# Patient Record
Sex: Female | Born: 1980 | Race: Black or African American | Hispanic: No | Smoking: Never smoker
Health system: Southern US, Community
[De-identification: ages and names within clinical notes are randomized; demographics above are authoritative.]

## PROBLEM LIST (undated history)

## (undated) DIAGNOSIS — M797 Fibromyalgia: Secondary | ICD-10-CM

## (undated) DIAGNOSIS — I10 Essential (primary) hypertension: Secondary | ICD-10-CM

## (undated) DIAGNOSIS — M549 Dorsalgia, unspecified: Secondary | ICD-10-CM

## (undated) DIAGNOSIS — J069 Acute upper respiratory infection, unspecified: Secondary | ICD-10-CM

## (undated) DIAGNOSIS — F32A Depression, unspecified: Secondary | ICD-10-CM

## (undated) DIAGNOSIS — G43909 Migraine, unspecified, not intractable, without status migrainosus: Secondary | ICD-10-CM

## (undated) DIAGNOSIS — F329 Major depressive disorder, single episode, unspecified: Secondary | ICD-10-CM

## (undated) HISTORY — PX: CHOLECYSTECTOMY: SHX55

## (undated) HISTORY — PX: BACK SURGERY: SHX140

## (undated) HISTORY — DX: Migraine, unspecified, not intractable, without status migrainosus: G43.909

## (undated) HISTORY — DX: Depression, unspecified: F32.A

## (undated) HISTORY — DX: Major depressive disorder, single episode, unspecified: F32.9

## (undated) HISTORY — DX: Fibromyalgia: M79.7

## (undated) HISTORY — DX: Dorsalgia, unspecified: M54.9

---

## 2011-08-12 DIAGNOSIS — I10 Essential (primary) hypertension: Secondary | ICD-10-CM

## 2011-08-12 DIAGNOSIS — I499 Cardiac arrhythmia, unspecified: Secondary | ICD-10-CM

## 2011-08-12 HISTORY — DX: Cardiac arrhythmia, unspecified: I49.9

## 2011-08-12 HISTORY — DX: Essential (primary) hypertension: I10

## 2011-08-30 DIAGNOSIS — R0602 Shortness of breath: Secondary | ICD-10-CM | POA: Insufficient documentation

## 2011-08-30 DIAGNOSIS — R002 Palpitations: Secondary | ICD-10-CM | POA: Insufficient documentation

## 2011-08-30 HISTORY — DX: Palpitations: R00.2

## 2011-08-30 HISTORY — DX: Shortness of breath: R06.02

## 2011-11-20 DIAGNOSIS — L293 Anogenital pruritus, unspecified: Secondary | ICD-10-CM

## 2011-11-20 DIAGNOSIS — H532 Diplopia: Secondary | ICD-10-CM

## 2011-11-20 DIAGNOSIS — M791 Myalgia, unspecified site: Secondary | ICD-10-CM | POA: Insufficient documentation

## 2011-11-20 DIAGNOSIS — R630 Anorexia: Secondary | ICD-10-CM | POA: Insufficient documentation

## 2011-11-20 DIAGNOSIS — R109 Unspecified abdominal pain: Secondary | ICD-10-CM

## 2011-11-20 DIAGNOSIS — M109 Gout, unspecified: Secondary | ICD-10-CM

## 2011-11-20 DIAGNOSIS — R5381 Other malaise: Secondary | ICD-10-CM

## 2011-11-20 DIAGNOSIS — G501 Atypical facial pain: Secondary | ICD-10-CM | POA: Insufficient documentation

## 2011-11-20 DIAGNOSIS — G5600 Carpal tunnel syndrome, unspecified upper limb: Secondary | ICD-10-CM

## 2011-11-20 DIAGNOSIS — R112 Nausea with vomiting, unspecified: Secondary | ICD-10-CM

## 2011-11-20 DIAGNOSIS — B009 Herpesviral infection, unspecified: Secondary | ICD-10-CM

## 2011-11-20 DIAGNOSIS — K5900 Constipation, unspecified: Secondary | ICD-10-CM | POA: Insufficient documentation

## 2011-11-20 DIAGNOSIS — R609 Edema, unspecified: Secondary | ICD-10-CM | POA: Insufficient documentation

## 2011-11-20 DIAGNOSIS — D508 Other iron deficiency anemias: Secondary | ICD-10-CM

## 2011-11-20 HISTORY — DX: Carpal tunnel syndrome, unspecified upper limb: G56.00

## 2011-11-20 HISTORY — DX: Nausea with vomiting, unspecified: R11.2

## 2011-11-20 HISTORY — DX: Other iron deficiency anemias: D50.8

## 2011-11-20 HISTORY — DX: Gout, unspecified: M10.9

## 2011-11-20 HISTORY — DX: Herpesviral infection, unspecified: B00.9

## 2011-11-20 HISTORY — DX: Atypical facial pain: G50.1

## 2011-11-20 HISTORY — DX: Diplopia: H53.2

## 2011-11-20 HISTORY — DX: Edema, unspecified: R60.9

## 2011-11-20 HISTORY — DX: Unspecified abdominal pain: R10.9

## 2011-11-20 HISTORY — DX: Myalgia, unspecified site: M79.10

## 2011-11-20 HISTORY — DX: Other malaise: R53.81

## 2011-11-20 HISTORY — DX: Anogenital pruritus, unspecified: L29.3

## 2014-09-02 ENCOUNTER — Encounter (HOSPITAL_COMMUNITY): Payer: Self-pay | Admitting: Emergency Medicine

## 2014-09-02 ENCOUNTER — Emergency Department (INDEPENDENT_AMBULATORY_CARE_PROVIDER_SITE_OTHER)
Admission: EM | Admit: 2014-09-02 | Discharge: 2014-09-02 | Disposition: A | Discharge status: Home / Self Care | Payer: Medicaid Other | Source: Home / Self Care | Attending: Family Medicine | Admitting: Family Medicine

## 2014-09-02 DIAGNOSIS — M609 Myositis, unspecified: Secondary | ICD-10-CM

## 2014-09-02 DIAGNOSIS — M7918 Myalgia, other site: Secondary | ICD-10-CM

## 2014-09-02 MED ORDER — TRIAMCINOLONE ACETONIDE 40 MG/ML IJ SUSP
INTRAMUSCULAR | Status: AC
  Filled 2014-09-02: qty 1

## 2014-09-02 MED ORDER — BUPIVACAINE HCL (PF) 0.5 % IJ SOLN
INTRAMUSCULAR | Status: AC
  Filled 2014-09-02: qty 10
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
## 2014-09-02 NOTE — Discharge Instructions (Signed)
Myofascial Pain Syndrome Myofascial pain syndrome is a pain disorder. This pain may be felt in the muscles. It may come and go. Myofascial pain syndrome always has trigger or tender points in the muscle that will cause pain when pressed.  CAUSES Myofascial pain may be caused by injuries, especially auto accidents, or by overuse of certain muscles. Typically the pain is long lasting. It is made worse by overuse of the involved muscles, emotional distress, and by cold, damp weather. Myofascial pain syndrome often develops in patients whose response to stress is an increase in muscle tone, and is seen in greater frequency in patients with pre-existing tension headaches. SYMPTOMS  Myofascial pain syndrome causes a wide variety of symptoms. You may see tight ropy bands of muscle. Problems may also include aching, cramping, burning, numbness, tingling, and other uncomfortable sensations in muscular areas. It most commonly affects the neck, upper back, and shoulder areas. Pain often radiates into the arms and hands.  TREATMENT Treatment includes resting the affected muscular area and applying ice packs to reduce spasm and pain. Trigger point injection, is a valuable initial therapy. This therapy is an injection of local anesthetic directly into the trigger point. Trigger points are often present at the source of pain. Pain relief following injection confirms the diagnosis of myofascial pain syndrome. Fairly vigorous therapy can be carried out during the pain-free period after each injection. Stretching exercises to loosen up the muscles are also useful. Transcutaneous electrical nerve stimulation (TENS) may provide relief from pain. TENS is the use of electric current produced by a device to stimulate the nerves. Ultrasound therapy applied directly over the affected muscle may also provide pain relief. Anti-inflammatory pain medicine can be helpful. Symptoms will gradually improve over a period of weeks to months  with proper treatment. HOME CARE INSTRUCTIONS Call your caregiver for follow-up care as recommended.  SEEK MEDICAL CARE IF:  Your pain is severe and not helped with medications. Document Released: 05/23/2004 Document Revised: 07/08/2011 Document Reviewed: 06/01/2010 [facility] Patient Information 2015 [employer] [place]. This information is not intended to replace advice given to you by your health care provider. Make sure you discuss any questions you have with your health care provider.  Trigger Point Injection Trigger points are areas where you have muscle pain. A trigger point injection is a shot given in the trigger point to relieve that pain. A trigger point might feel like a knot in your muscle. It hurts to press on a trigger point. Sometimes the pain spreads out (radiates) to other parts of the body. For example, pressing on a trigger point in your shoulder might cause pain in your arm or neck. You might have one trigger point. Or, you might have more than one. People often have trigger points in their upper back and lower back. They also occur often in the neck and shoulders. Pain from a trigger point lasts for a long time. It can make it hard to keep moving. You might not be able to do the exercise or physical therapy that could help you deal with the pain. A trigger point injection may help. It does not work for everyone. But, it may relieve your pain for a few days or a few months. A trigger point injection does not cure long-lasting (chronic) pain. LET YOUR CAREGIVER KNOW ABOUT:  Any allergies (especially to latex, lidocaine, or steroids).  Blood-thinning medicines that you take. These drugs can lead to bleeding or bruising after an injection. They include:  Aspirin.  Ibuprofen.  Clopidogrel.  Warfarin.  Other medicines you take. This includes all vitamins, herbs, eyedrops, over-the-counter medicines, and creams.  Use of steroids.  Recent infections.  Past problems with numbing  medicines.  Bleeding problems.  Surgeries you have had.  Other health problems. RISKS AND COMPLICATIONS A trigger point injection is a safe treatment. However, problems may develop, such as:  Minor side effects usually go away in 1 to 2 days. These may include:  Soreness.  Bruising.  Stiffness.  More serious problems are rare. But, they may include:  Bleeding under the skin (hematoma).  Skin infection.  Breaking off of the needle under your skin.  Lung puncture.  The trigger point injection may not work for you. BEFORE THE PROCEDURE You may need to stop taking any medicine that thins your blood. This is to prevent bleeding and bruising. Usually these medicines are stopped several days before the injection. No other preparation is needed. PROCEDURE  A trigger point injection can be given in your caregiver's office or in a clinic. Each injection takes 2 minutes or less.  Your caregiver will feel for trigger points. The caregiver may use a marker to circle the area for the injection.  The skin over the trigger point will be washed with a germ-killing (antiseptic) solution.  The caregiver pinches the spot for the injection.  Then, a very thin needle is used for the shot. You may feel pain or a twitching feeling when the needle enters the trigger point.  A numbing solution may be injected into the trigger point. Sometimes a drug to keep down swelling, redness, and warmth (inflammation) is also injected.  Your caregiver moves the needle around the trigger zone until the tightness and twitching goes away.  After the injection, your caregiver may put gentle pressure over the injection site.  Then it is covered with a bandage. AFTER THE PROCEDURE  You can go right home after the injection.  The bandage can be taken off after a few hours.  You may feel sore and stiff for 1 to 2 days.  Go back to your regular activities slowly. Your caregiver may ask you to stretch your  muscles. Do not do anything that takes extra energy for a few days.  Follow your caregiver's instructions to manage and treat other pain. Document Released: 04/04/2011 Document Revised: 08/10/2012 Document Reviewed: 04/04/2011 [facility] Patient Information 2015 [place], [place]. This information is not intended to replace advice given to you by your health care provider. Make sure you discuss any questions you have with your health care provider.

## 2014-10-06 ENCOUNTER — Ambulatory Visit: Payer: Self-pay | Admitting: Family Medicine

## 2014-10-19 ENCOUNTER — Ambulatory Visit: Payer: Self-pay | Admitting: Family Medicine

## 2014-11-03 ENCOUNTER — Ambulatory Visit (INDEPENDENT_AMBULATORY_CARE_PROVIDER_SITE_OTHER): Payer: Medicaid Other | Admitting: Family Medicine

## 2014-11-03 ENCOUNTER — Encounter: Payer: Self-pay | Admitting: Family Medicine

## 2014-11-03 DIAGNOSIS — R739 Hyperglycemia, unspecified: Secondary | ICD-10-CM

## 2014-11-03 DIAGNOSIS — M5136 Other intervertebral disc degeneration, lumbar region: Secondary | ICD-10-CM

## 2014-11-03 DIAGNOSIS — F329 Major depressive disorder, single episode, unspecified: Secondary | ICD-10-CM | POA: Diagnosis not present

## 2014-11-03 DIAGNOSIS — M51369 Other intervertebral disc degeneration, lumbar region without mention of lumbar back pain or lower extremity pain: Secondary | ICD-10-CM

## 2014-11-03 DIAGNOSIS — M5126 Other intervertebral disc displacement, lumbar region: Secondary | ICD-10-CM | POA: Diagnosis not present

## 2014-11-03 DIAGNOSIS — F32A Depression, unspecified: Secondary | ICD-10-CM | POA: Insufficient documentation

## 2014-11-03 DIAGNOSIS — Z7189 Other specified counseling: Secondary | ICD-10-CM

## 2014-11-03 DIAGNOSIS — R7309 Other abnormal glucose: Secondary | ICD-10-CM

## 2014-11-03 DIAGNOSIS — Z7689 Persons encountering health services in other specified circumstances: Secondary | ICD-10-CM

## 2014-11-03 DIAGNOSIS — K219 Gastro-esophageal reflux disease without esophagitis: Secondary | ICD-10-CM

## 2014-11-03 HISTORY — DX: Hyperglycemia, unspecified: R73.9

## 2014-11-03 HISTORY — DX: Other intervertebral disc degeneration, lumbar region: M51.36

## 2014-11-03 HISTORY — DX: Other intervertebral disc degeneration, lumbar region without mention of lumbar back pain or lower extremity pain: M51.369

## 2014-11-03 LAB — COMPLETE METABOLIC PANEL WITH GFR
ALBUMIN: 3.7 g/dL (ref 3.5–5.2)
ALK PHOS: 95 U/L (ref 39–117)
ALT: 22 U/L (ref 0–35)
AST: 29 U/L (ref 0–37)
BUN: 7 mg/dL (ref 6–23)
CO2: 24 mEq/L (ref 19–32)
Calcium: 9.2 mg/dL (ref 8.4–10.5)
Chloride: 105 mEq/L (ref 96–112)
Creat: 0.76 mg/dL (ref 0.50–1.10)
GFR, Est African American: >89 mL/min
GLUCOSE: 100 mg/dL — AB (ref 70–99)
POTASSIUM: 3.5 meq/L (ref 3.5–5.3)
SODIUM: 141 meq/L (ref 135–145)
TOTAL PROTEIN: 7.6 g/dL (ref 6.0–8.3)
Total Bilirubin: 0.2 mg/dL (ref 0.2–1.2)

## 2014-11-03 LAB — LIPID PANEL
CHOL/HDL RATIO: 2.8 ratio
Cholesterol: 153 mg/dL (ref 0–200)
HDL: 55 mg/dL (ref >=46)
LDL Cholesterol: 79 mg/dL (ref 0–99)
Triglycerides: 96 mg/dL (ref <150)
VLDL: 19 mg/dL (ref 0–40)

## 2014-11-03 LAB — TSH: TSH: 0.591 u[IU]/mL (ref 0.350–4.500)

## 2014-11-03 MED ORDER — OMEPRAZOLE 20 MG PO CPDR: 20.0000 mg | DELAYED_RELEASE_CAPSULE | Freq: Every day | ORAL | Status: DC

## 2014-11-03 NOTE — Progress Notes (Signed)
Patient ID: Maesyn Frisinger, female   DOB: 28-Mar-1981, 34 y.o.   MRN: [id number]   Aiyanah Kalama, is a 34 y.o. female  WUJ:[id number]  [id number]  DOB - 14-Feb-1981  CC:  Chief Complaint  Patient presents with  . Establish Care    wants a referral to rhumatology for fibromylgia        HPI: Ahnna Tramontana is a 34 y.o. female here to establish care. She has recently moved here and needs to establish care and is requesting referrals to specialist. He has a history of a bulging lumbar disc and fibrodysplasia. She has no records with her and the exact type of dysplasia is not known at this time. Surgery has been suggested but apparently that is not a good (or viable option). She has been followed by specialist [facility]. She has been followed for fibromyalgia by a rheumatologist and has had pain managed by a pain clinic.  I have explained that we need records from her previous doctors in order to make referral. She has been diagnosed with Depression and has already establish with [facility]. She has been on Fentanyl patches and oxycodone for pain management. She has been on Cymbalta and more recently Sebella. She has a diagnosis of morbid obesity, GERD and she mentions the possibility of Celiac Disease. She is on Omeprozole and needs a refill and she has eliminated wheat from her diet.  No Known Allergies Past Medical History  Diagnosis Date  . Fibromyalgia   . Depression    Current Outpatient Prescriptions on File Prior to Visit  Medication Sig Dispense Refill  . cyclobenzaprine (FLEXERIL) 5 MG tablet Take 5 mg by mouth 3 (three) times daily as needed for muscle spasms.    Marland Kitchen ibuprofen (ADVIL,MOTRIN) 800 MG tablet Take 800 mg by mouth every 8 (eight) hours as needed.    . Liniments (ACE PAIN RELIEVING PATCH EX) Apply topically.    Marland Kitchen oxycodone (OXY-IR) 5 MG capsule Take by mouth every 4 (four) hours as needed.      No current facility-administered medications on  file prior to visit.   Family History  Problem Relation Age of Onset  . Hypertension Mother   . Diabetes Father   . Hyperlipidemia Father   . Diabetes Sister   . Birth defects Maternal Grandfather    History   Social History  . Marital Status: Divorced    Spouse Name: N/A  . Number of Children: N/A  . Years of Education: N/A   Occupational History  . Not on file.   Social History Main Topics  . Smoking status: Never Smoker   . Smokeless tobacco: Never Used  . Alcohol Use: No  . Drug Use: No  . Sexual Activity: No   Other Topics Concern  . Not on file   Social History Narrative    Review of Systems: Constitutional: Negative for fever, chills, appetite change, weight loss,  Positive for fatigue, loss of appetite HENT: Negative for ear pain, ear discharge.nose bleeds. Positive for nasal allergies Eyes: Negative for pain, discharge, redness, itching and visual disturbance. Neck: Negative for pain, stiffness Respiratory: Negative for cough. Positive shortness of breath with walking Cardiovascular: Negative for chest pain, palpitations and leg swelling. Gastrointestinal: Negative for abdominal distention, abdominal pain, nausea, vomiting, diarrhea, constipations. Positive for heartburn Genitourinary: Negative for dysuria, urgency, hematuria. Positive for frequency Musculoskeletal: Positive for back pain, right shoulder pain, hip and ankle pain. joint  swelling, arthralgia and gait problem.Negative for weakness.  Neurological: Negative for dizziness, tremors, seizures, syncope,   light-headedness, numbness. Positive for headaches Hematological: Negative for easy bruising or bleeding Psychiatric/Behavioral: Negative for depression, anxiety, decreased concentration, confusion   Objective:   Filed Vitals:   11/03/14 1528  BP: 123/75  Pulse: 110  Temp: 98.2 F (36.8 C)  Resp: 16    Physical Exam: Constitutional: Patient appears well-developed and well-nourished. No  distress. Morbidly obese HENT: Normocephalic, atraumatic, External right and left ear normal. Oropharynx is clear and moist.  Eyes: Conjunctivae and EOM are normal. PERRLA, no scleral icterus. Neck: Normal ROM. Neck supple. No lymphadenopathy, No thyromegaly. CVS: RRR, S1/S2 +, no murmurs, no gallops, no rubs Pulmonary: Effort and breath sounds normal, no stridor, rhonchi, wheezes, rales.  Abdominal: Soft. Normoactive BS,, no distension, tenderness, rebound or guarding.  Musculoskeletal: Normal range of motion. No edema and no tenderness. There is tenderness over the lower back. Neuro: Alert.Normal muscle tone coordination. Non-focal Skin: Skin is warm and dry. No rash noted. Not diaphoretic. No erythema. No pallor. Psychiatric: Normal mood and affect. Behavior, judgment, thought content normal.  No results found for: WBC, HGB, HCT, MCV, PLT No results found for: CREATININE, BUN, NA, K, CL, CO2  No results found for: HGBA1C Lipid Panel  No results found for: CHOL, TRIG, HDL, CHOLHDL, VLDL, LDLCALC     Assessment and plan:   1. Obesity, morbid  - COMPLETE METABOLIC PANEL WITH GFR - Lipid panel - TSH  2. Elevated blood sugar  - COMPLETE METABOLIC PANEL WITH GFR - Lipid pan     3. Depression  - follow-up with [facility] as planned.   4. Bulging lumbar disc with fibrodysplasia -Referral to orthopedist -Referral to pain clinic  5. Fibromyalgia -referral to pain clinic   6. GERD - continue omeperazole 20 mg daily   Return in about 3 months (around 02/03/2015).  The patient was given clear instructions to go to ER or return to medical center if symptoms don't improve, worsen or new problems develop. The patient verbalized understanding. The patient was told to call to get lab results if they haven't heard anything in the next week.       Henrietta HooverLinda C. Bernhardt, MSN, FNP-BC   11/03/2014, 4:22 PM

## 2014-11-03 NOTE — Patient Instructions (Addendum)
Continue your current medical regimine [facility] We will work on the referrals you have requested. Return in 3 months, sooner if needed. We will need records from your previous doctors.

## 2014-11-04 DIAGNOSIS — K219 Gastro-esophageal reflux disease without esophagitis: Secondary | ICD-10-CM | POA: Insufficient documentation

## 2014-11-04 HISTORY — DX: Gastro-esophageal reflux disease without esophagitis: K21.9

## 2015-02-06 ENCOUNTER — Ambulatory Visit: Payer: Self-pay | Admitting: Family Medicine

## 2016-06-16 DIAGNOSIS — K819 Cholecystitis, unspecified: Secondary | ICD-10-CM

## 2016-06-16 HISTORY — DX: Cholecystitis, unspecified: K81.9

## 2018-07-18 ENCOUNTER — Emergency Department (HOSPITAL_COMMUNITY)
Admission: EM | Admit: 2018-07-18 | Discharge: 2018-07-18 | Disposition: A | Discharge status: Home / Self Care | Payer: Medicare Other | Attending: Emergency Medicine | Admitting: Emergency Medicine

## 2018-07-18 ENCOUNTER — Other Ambulatory Visit: Payer: Self-pay

## 2018-07-18 ENCOUNTER — Emergency Department (HOSPITAL_COMMUNITY): Payer: Medicare Other

## 2018-07-18 ENCOUNTER — Encounter (HOSPITAL_COMMUNITY): Payer: Self-pay

## 2018-07-18 DIAGNOSIS — R0989 Other specified symptoms and signs involving the circulatory and respiratory systems: Secondary | ICD-10-CM | POA: Diagnosis not present

## 2018-07-18 DIAGNOSIS — J069 Acute upper respiratory infection, unspecified: Secondary | ICD-10-CM | POA: Diagnosis not present

## 2018-07-18 DIAGNOSIS — B9789 Other viral agents as the cause of diseases classified elsewhere: Secondary | ICD-10-CM | POA: Diagnosis not present

## 2018-07-18 DIAGNOSIS — R0789 Other chest pain: Secondary | ICD-10-CM | POA: Diagnosis not present

## 2018-07-18 DIAGNOSIS — R51 Headache: Secondary | ICD-10-CM

## 2018-07-18 DIAGNOSIS — R519 Headache, unspecified: Secondary | ICD-10-CM

## 2018-07-18 LAB — BASIC METABOLIC PANEL
ANION GAP: 5 (ref 5–15)
BUN: 11 mg/dL (ref 6–20)
CO2: 28 mmol/L (ref 22–32)
Calcium: 8.3 mg/dL — ABNORMAL LOW (ref 8.9–10.3)
Chloride: 107 mmol/L (ref 98–111)
Creatinine, Ser: 0.71 mg/dL (ref 0.44–1.00)
GFR calc Af Amer: >60 mL/min (ref >60)
GFR calc non Af Amer: >60 mL/min (ref >60)
GLUCOSE: 104 mg/dL — AB (ref 70–99)
Potassium: 3.1 mmol/L — ABNORMAL LOW (ref 3.5–5.1)
Sodium: 140 mmol/L (ref 135–145)

## 2018-07-18 LAB — CBC
HCT: 35.7 % — ABNORMAL LOW (ref 36.0–46.0)
Hemoglobin: 10.7 g/dL — ABNORMAL LOW (ref 12.0–15.0)
MCH: 26.2 pg (ref 26.0–34.0)
MCHC: 30 g/dL (ref 30.0–36.0)
MCV: 87.3 fL (ref 80.0–100.0)
Platelets: 285 10*3/uL (ref 150–400)
RBC: 4.09 MIL/uL (ref 3.87–5.11)
RDW: 14.2 % (ref 11.5–15.5)
WBC: 5.5 10*3/uL (ref 4.0–10.5)
nRBC: 0 % (ref 0.0–0.2)

## 2018-07-18 IMAGING — CR CHEST - 2 VIEW
2 series · 2 of 2 positions shown · non-contrast
Comparison: None.

CLINICAL DATA: Shortness of breath and left-sided chest pain

EXAM:
CHEST - 2 VIEW

[w chest pa]
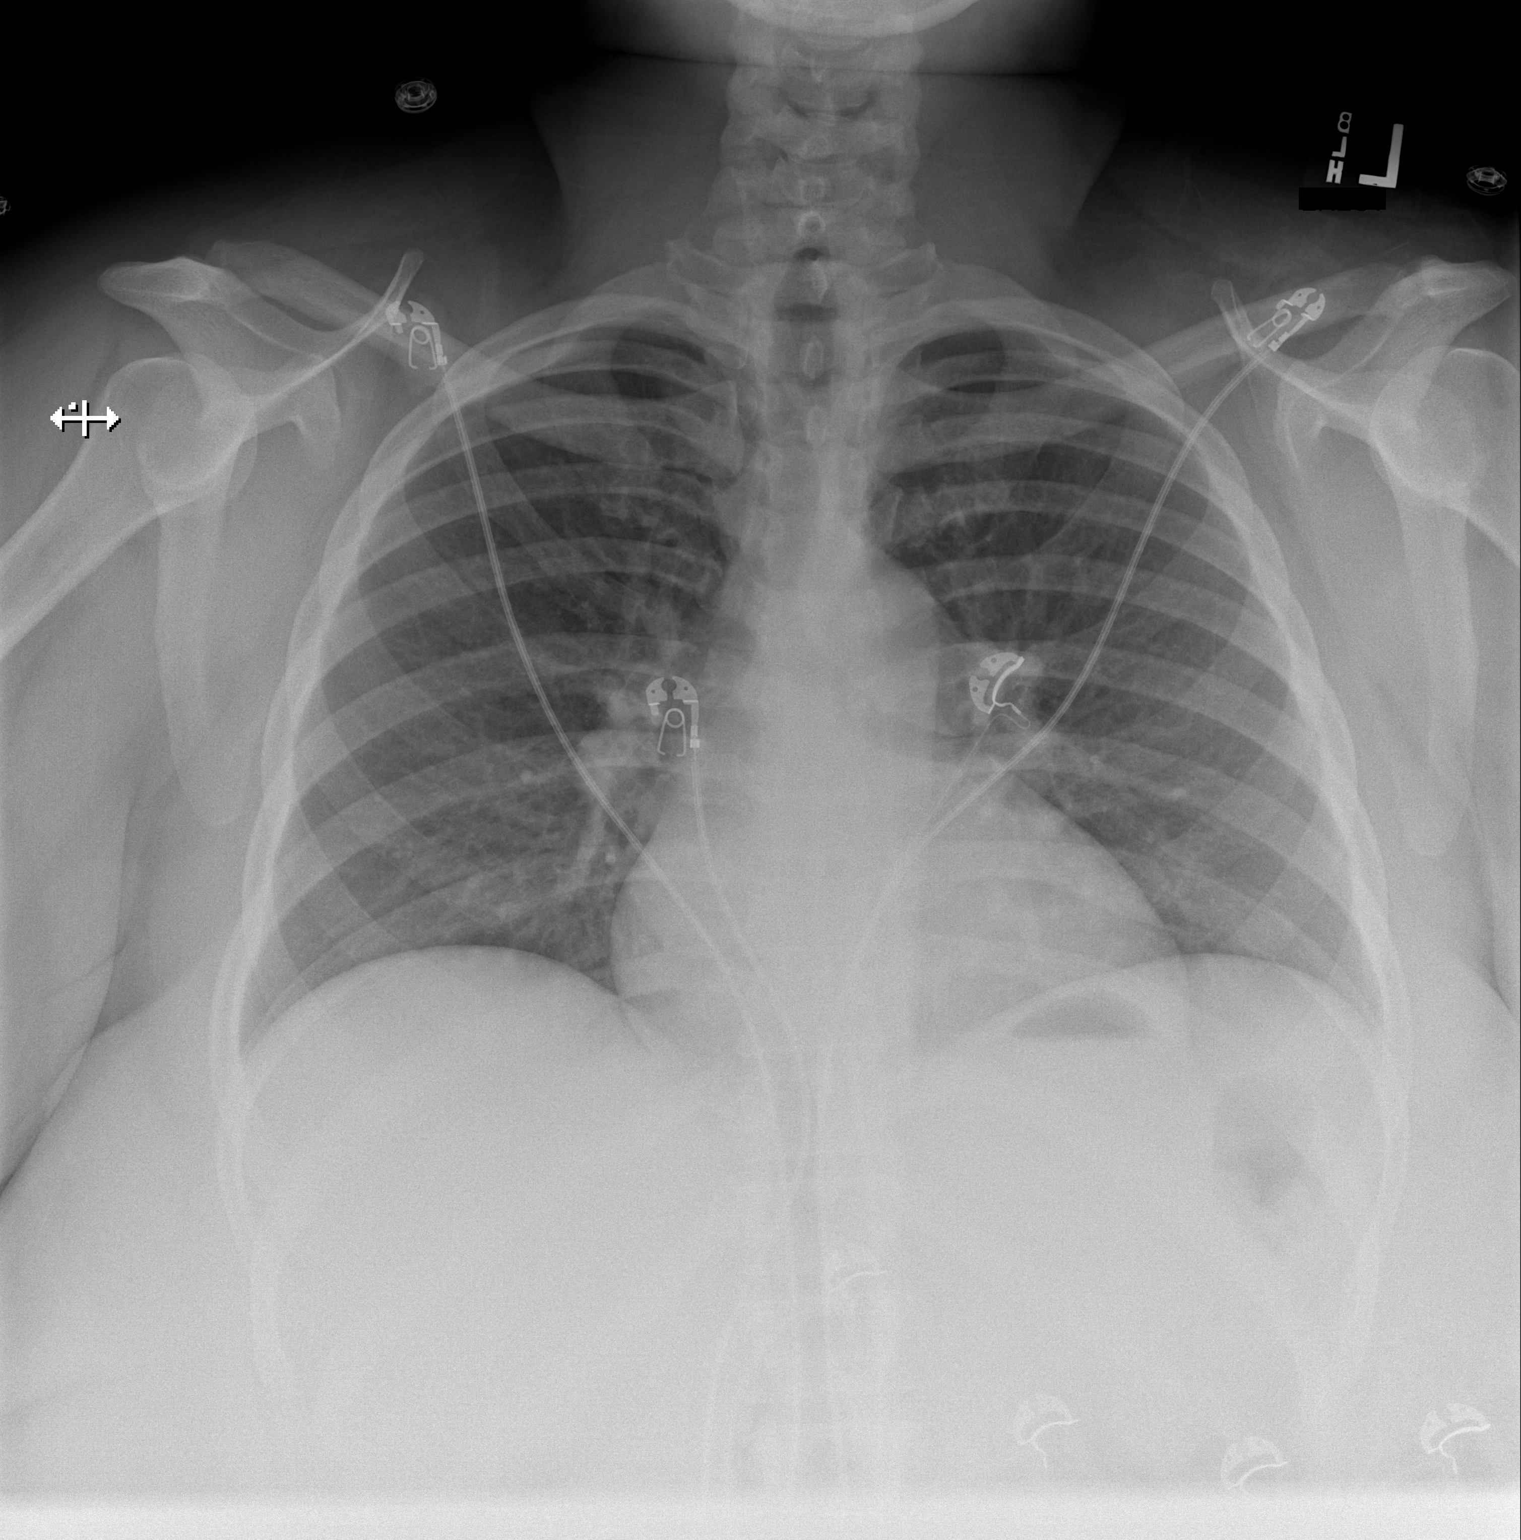

[w chest lat]
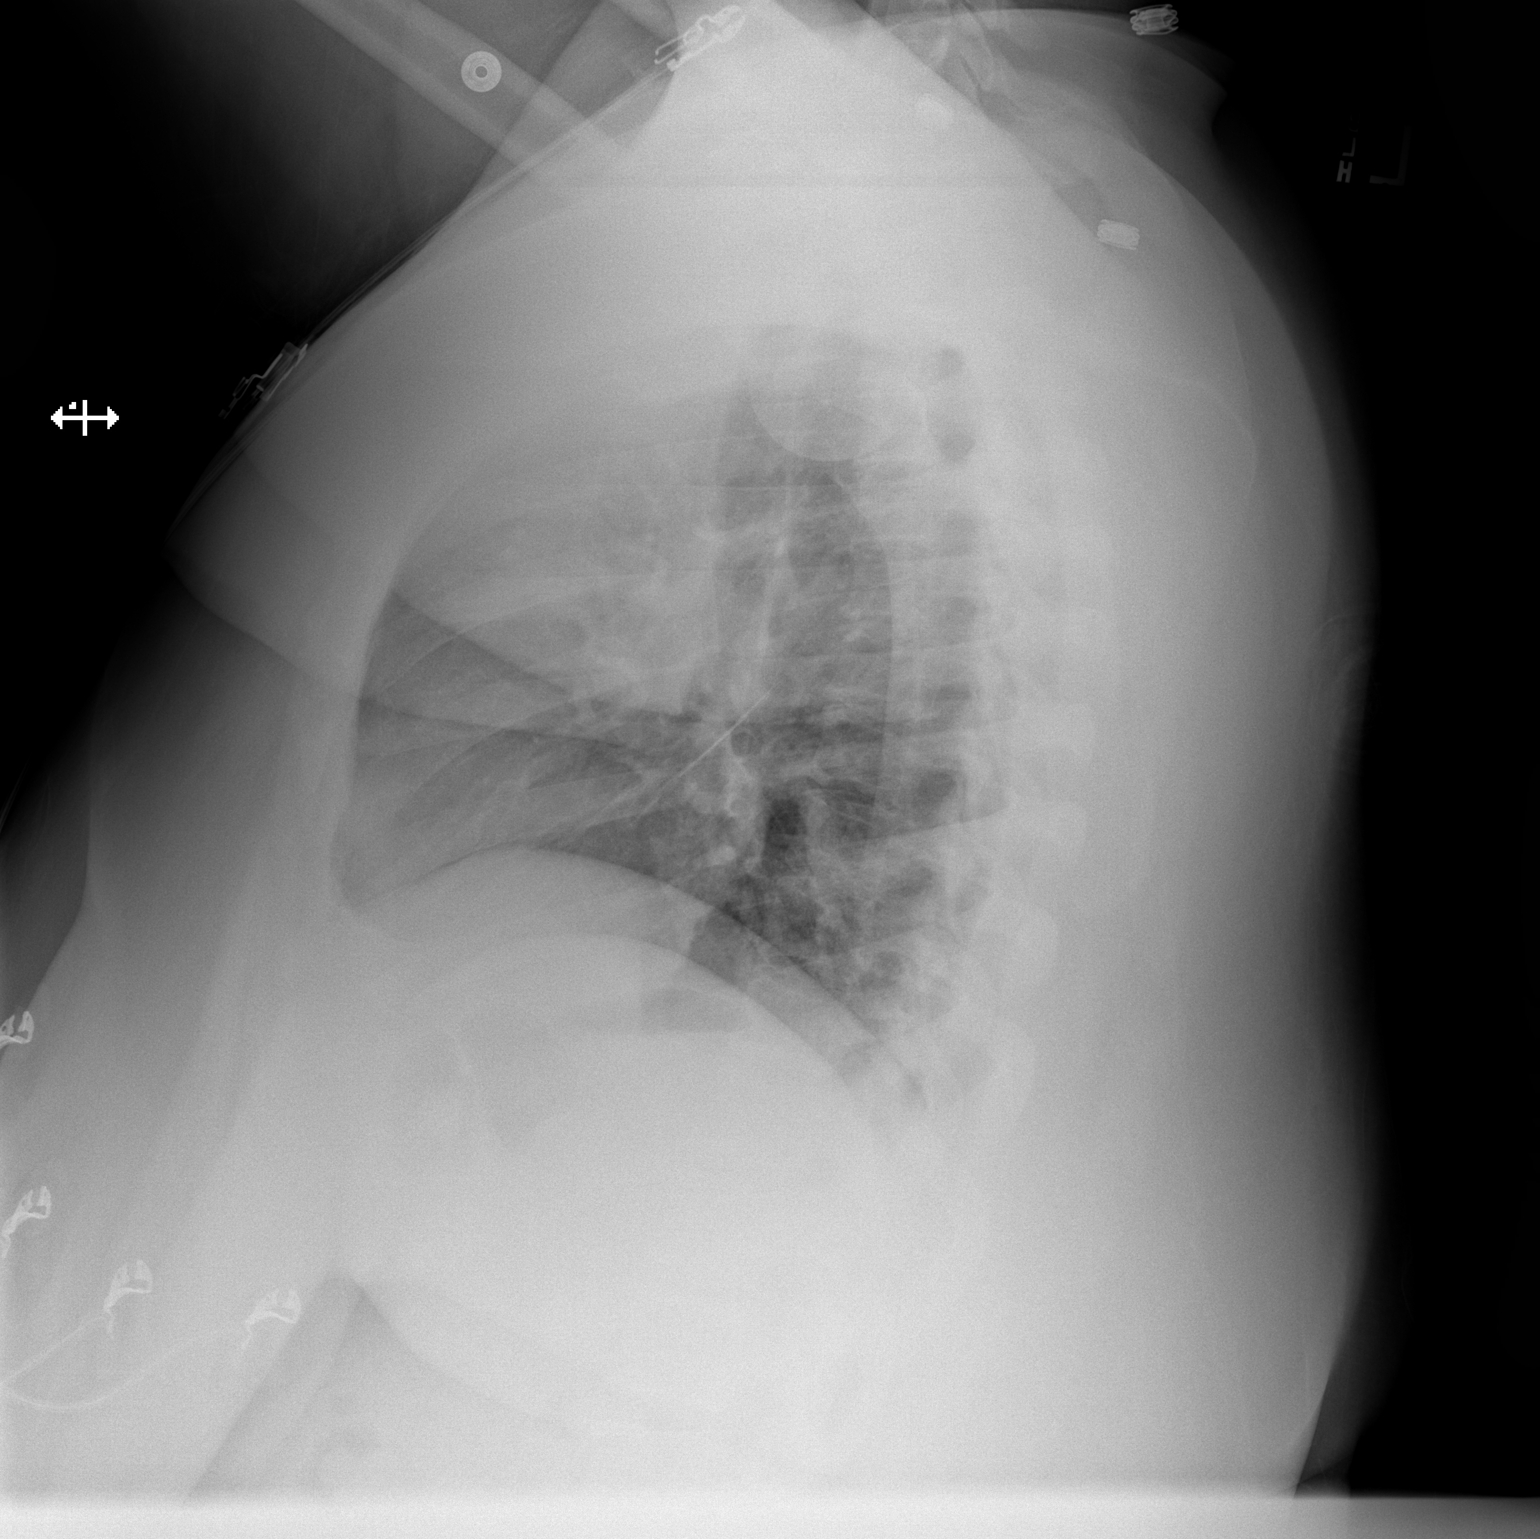

[2 of 2 positions shown; findings below may reference images not displayed]

FINDINGS: The lungs are clear without focal pneumonia, edema, pneumothorax or
pleural effusion. The cardiopericardial silhouette is within normal
limits for size. The visualized bony structures of the thorax are
intact. Telemetry leads overlie the chest.
IMPRESSION: No active cardiopulmonary disease.

## 2018-07-18 MED ORDER — POTASSIUM CHLORIDE CRYS ER 20 MEQ PO TBCR
40.0000 meq | EXTENDED_RELEASE_TABLET | Freq: Once | ORAL | Status: AC
  Administered 2018-07-18: 40 meq via ORAL
  Filled 2018-07-18: qty 2

## 2018-07-18 MED ORDER — KETOROLAC TROMETHAMINE 15 MG/ML IJ SOLN
15.0000 mg | Freq: Once | INTRAMUSCULAR | Status: AC
  Administered 2018-07-18: 15 mg via INTRAVENOUS
  Filled 2018-07-18: qty 1

## 2018-07-18 MED ORDER — DIPHENHYDRAMINE HCL 50 MG/ML IJ SOLN
12.5000 mg | Freq: Once | INTRAMUSCULAR | Status: AC
  Administered 2018-07-18: 12.5 mg via INTRAVENOUS
  Filled 2018-07-18: qty 1

## 2018-07-18 MED ORDER — METOCLOPRAMIDE HCL 5 MG/ML IJ SOLN
10.0000 mg | Freq: Once | INTRAMUSCULAR | Status: AC
  Administered 2018-07-18: 10 mg via INTRAVENOUS
  Filled 2018-07-18: qty 2

## 2018-07-18 MED ORDER — SODIUM CHLORIDE 0.9 % IV BOLUS
1000.0000 mL | Freq: Once | INTRAVENOUS | Status: AC
  Administered 2018-07-18: 1000 mL via INTRAVENOUS

## 2018-07-18 NOTE — ED Provider Notes (Signed)
[facility]-EMERGENCY DEPT Provider Note   CSN: [id number] Arrival date & time: 07/18/18  1804    History   Chief Complaint Chief Complaint  Patient presents with  . Migraine  . Chest Pain    HPI Emily Ochoa is a 38 y.o. female who presents with headache and a chest pain.  Past medical history significant for obesity, GERD, depression, high blood pressure.  The patient states that for the past week and a half she has had URI symptoms.  She reports runny nose, dry cough, and a low-grade subjective fever.  She has had sick contacts with her daughter who is had similar symptoms and is now well.  She denies any recent travel.  She has been taking over-the-counter medicines.  Today she woke up with some left-sided chest tightness which is tender to touch which concerned her as well as the worsening cough so she decided to come to the ED.  She states that headache is on the top of her head.  Is been coming and going for the past week.  She describes as a "migraine" although was not formally diagnosed with this.  No ear pain, sore throat, loss of consciousness, chest pain, shortness of breath, abdominal pain, nausea, vomiting, diarrhea, urinary symptoms.     HPI  Past Medical History:  Diagnosis Date  . Depression   . Fibromyalgia     Patient Active Problem List   Diagnosis Date Noted  . GERD (gastroesophageal reflux disease) 11/04/2014  . Elevated blood sugar 11/03/2014  . Morbid obesity (HCC) 11/03/2014  . Depression 11/03/2014  . Bulging lumbar disc 11/03/2014    Past Surgical History:  Procedure Laterality Date  . BACK SURGERY     bioposy in 2009     OB History   No obstetric history on file.      Home Medications    Prior to Admission medications   Medication Sig Start Date End Date Taking? Authorizing Provider  cyclobenzaprine (FLEXERIL) 5 MG tablet Take 5 mg by mouth 3 (three) times daily as needed for muscle spasms.    [provider]  DULoxetine (CYMBALTA) 60 MG capsule Take 60 mg by mouth daily.    [provider]  ibuprofen (ADVIL,MOTRIN) 800 MG tablet Take 800 mg by mouth every 8 (eight) hours as needed.    [provider]  Liniments (ACE PAIN RELIEVING PATCH EX) Apply topically.    [provider]  Milnacipran HCl (SAVELLA) 25 MG TABS Take 1 tablet by mouth 3 (three) times daily.    [provider]  omeprazole (PRILOSEC) 20 MG capsule Take 1 capsule (20 mg total) by mouth daily. 11/03/14   Henrietta Hoover, NP  oxycodone (OXY-IR) 5 MG capsule Take by mouth every 4 (four) hours as needed.     [provider]    Family History Family History  Problem Relation Age of Onset  . Diabetes Sister   . Hypertension Mother   . Diabetes Father   . Hyperlipidemia Father   . Birth defects Maternal Grandfather     Social History Social History   Tobacco Use  . Smoking status: Never Smoker  . Smokeless tobacco: Never Used  Substance Use Topics  . Alcohol use: No  . Drug use: No     Allergies   Patient has no known allergies.   Review of Systems Review of Systems  Constitutional: Positive for fever.  HENT: Positive for congestion and rhinorrhea. Negative for ear pain  and sore throat.   Eyes: Negative for visual disturbance.  Respiratory: Positive for cough and chest tightness. Negative for shortness of breath.   Cardiovascular: Negative for chest pain, palpitations and leg swelling.  Gastrointestinal: Negative for abdominal pain, diarrhea, nausea and vomiting.  Genitourinary: Negative for dysuria.  All other systems reviewed and are negative.    Physical Exam Updated Vital Signs BP (!) 160/103 (BP Location: Left Arm)   Pulse 76   Temp 97.8 F (36.6 C) (Oral)   Resp 16   Ht  (1.626 m)   Wt (!) 142.9 kg   LMP 07/11/2018   SpO2 100%   BMI 54.07 kg/m   Physical Exam Vitals signs and nursing note reviewed.  Constitutional:       General: She is not in acute distress.    Appearance: She is well-developed. She is obese. She is not ill-appearing.     Comments: Well appearing, texting on phone  HENT:     Head: Normocephalic and atraumatic.     Right Ear: Tympanic membrane normal.     Left Ear: Tympanic membrane normal.     Nose: Nose normal.  Eyes:     General: No scleral icterus.       Right eye: No discharge.        Left eye: No discharge.     Conjunctiva/sclera: Conjunctivae normal.     Pupils: Pupils are equal, round, and reactive to light.  Neck:     Musculoskeletal: Normal range of motion.  Cardiovascular:     Rate and Rhythm: Normal rate and regular rhythm.  Pulmonary:     Effort: Pulmonary effort is normal. No respiratory distress.     Breath sounds: Normal breath sounds.  Abdominal:     General: There is no distension.     Palpations: Abdomen is soft.  Musculoskeletal:     Right lower leg: No edema.     Left lower leg: No edema.  Skin:    General: Skin is warm and dry.  Neurological:     Mental Status: She is alert and oriented to person, place, and time.  Psychiatric:        Behavior: Behavior normal.      ED Treatments / Results  Labs (all labs ordered are listed, but only abnormal results are displayed) Labs Reviewed  BASIC METABOLIC PANEL - Abnormal; Notable for the following components:      Result Value   Potassium 3.1 (*)    Glucose, Bld 104 (*)    Calcium 8.3 (*)    All other components within normal limits  CBC - Abnormal; Notable for the following components:   Hemoglobin 10.7 (*)    HCT 35.7 (*)    All other components within normal limits    EKG EKG Interpretation  Date/Time:  Saturday July 18 2018 18:12:12 EDT Ventricular Rate:  71 PR Interval:    QRS Duration: 100 QT Interval:  405 QTC Calculation: 441 R Axis:   60 Text Interpretation:  Sinus rhythm Borderline T abnormalities, anterior leads Baseline wander in lead(s) II III aVF No old tracing to compare  Confirmed by Mancel Bale [phone]) on 07/18/2018 7:08:11 PM   Radiology Dg Chest 2 View  Result Date: 07/18/2018 CLINICAL DATA:  Shortness of breath and left-sided chest pain EXAM: CHEST - 2 VIEW COMPARISON:  None. FINDINGS: The lungs are clear without focal pneumonia, edema, pneumothorax or pleural effusion. The cardiopericardial silhouette is within normal limits for size. The visualized bony  structures of the thorax are intact. Telemetry leads overlie the chest. IMPRESSION: No active cardiopulmonary disease. Electronically Signed   By: Kennith Center M.D.   On: 07/18/2018 19:04    Procedures Procedures (including critical care time)  Medications Ordered in ED Medications  ketorolac (TORADOL) 15 MG/ML injection 15 mg (15 mg Intravenous Given 07/18/18 1924)  metoCLOPramide (REGLAN) injection 10 mg (10 mg Intravenous Given 07/18/18 1924)  diphenhydrAMINE (BENADRYL) injection 12.5 mg (12.5 mg Intravenous Given 07/18/18 1923)  sodium chloride 0.9 % bolus 1,000 mL (0 mLs Intravenous Stopped 07/18/18 2026)  potassium chloride SA (K-DUR,KLOR-CON) CR tablet 40 mEq (40 mEq Oral Given 07/18/18 1953)     Initial Impression / Assessment and Plan / ED Course  I have reviewed the triage vital signs and the nursing notes.  Pertinent labs & imaging results that were available during my care of the patient were reviewed by me and considered in my medical decision making (see chart for details).  38 year old female presents with intermittent headache for the past week, URI symptoms with a cough, and now some left-sided chest tightness since this morning.  She is hypertensive but otherwise vital signs are normal.  HEENT exam is unremarkable.  Heart is regular rate and rhythm.  Lungs are clear to auscultation.  She is a normal neurologic exam and is ambulatory.  Will obtain EKG, labs, chest x-ray.  Will give migraine cocktail.  Low suspicion for any intracranial pathology with symptoms ongoing for over a week  now. Low suspicion for flu, pneumonia, COVID. No known contacts with any COVID pt and no recent travel.  CBC is remarkable for mild anemia.  BMP is remarkable for mild hypokalemia.  Potassium was given here.  Chest x-ray is normal.  EKG is sinus rhythm.  Symptoms have resolved after migraine cocktail.  Supportive care was recommended.  She was given return precautions for new or worsening symptoms.  Final Clinical Impressions(s) / ED Diagnoses   Final diagnoses:  Chest wall pain  Viral URI with cough  Bad headache    ED Discharge Orders    None       Bethel Born, PA-C 07/18/18 2030    Mancel Bale, MD 07/18/18 2226

## 2018-07-18 NOTE — Discharge Instructions (Signed)
Please rest and drink plenty of fluids Take over the counter cough and cold medicine Please return if you are worsening

## 2018-07-18 NOTE — ED Triage Notes (Signed)
Pt states that she hasn't been feeling well for a while. Pt states that she developed a migraine x1.5 weeks that has gotten progressively worse, and developed left sided chest pain since.

## 2018-07-18 NOTE — ED Notes (Signed)
Pt ambulated to restroom at this time.

## 2018-08-31 ENCOUNTER — Emergency Department (HOSPITAL_COMMUNITY)
Admission: EM | Admit: 2018-08-31 | Discharge: 2018-08-31 | Disposition: A | Discharge status: Home / Self Care | Payer: Medicare Other | Attending: Emergency Medicine | Admitting: Emergency Medicine

## 2018-08-31 ENCOUNTER — Other Ambulatory Visit: Payer: Self-pay

## 2018-08-31 ENCOUNTER — Encounter (HOSPITAL_COMMUNITY): Payer: Self-pay

## 2018-08-31 ENCOUNTER — Emergency Department (HOSPITAL_COMMUNITY): Payer: Medicare Other

## 2018-08-31 DIAGNOSIS — J4521 Mild intermittent asthma with (acute) exacerbation: Secondary | ICD-10-CM | POA: Diagnosis not present

## 2018-08-31 DIAGNOSIS — I10 Essential (primary) hypertension: Secondary | ICD-10-CM | POA: Insufficient documentation

## 2018-08-31 DIAGNOSIS — Z91040 Latex allergy status: Secondary | ICD-10-CM | POA: Insufficient documentation

## 2018-08-31 DIAGNOSIS — R0602 Shortness of breath: Secondary | ICD-10-CM | POA: Diagnosis present

## 2018-08-31 DIAGNOSIS — Z79899 Other long term (current) drug therapy: Secondary | ICD-10-CM | POA: Insufficient documentation

## 2018-08-31 HISTORY — DX: Acute upper respiratory infection, unspecified: J06.9

## 2018-08-31 HISTORY — DX: Essential (primary) hypertension: I10

## 2018-08-31 LAB — BASIC METABOLIC PANEL
Anion gap: 4 — ABNORMAL LOW (ref 5–15)
BUN: 11 mg/dL (ref 6–20)
CO2: 21 mmol/L — ABNORMAL LOW (ref 22–32)
Calcium: 8.9 mg/dL (ref 8.9–10.3)
Chloride: 113 mmol/L — ABNORMAL HIGH (ref 98–111)
Creatinine, Ser: 0.83 mg/dL (ref 0.44–1.00)
GFR calc Af Amer: >60 mL/min (ref >60)
GFR calc non Af Amer: >60 mL/min (ref >60)
Glucose, Bld: 99 mg/dL (ref 70–99)
Potassium: 4.1 mmol/L (ref 3.5–5.1)
Sodium: 138 mmol/L (ref 135–145)

## 2018-08-31 LAB — CBC
HCT: 40.7 % (ref 36.0–46.0)
Hemoglobin: 12.2 g/dL (ref 12.0–15.0)
MCH: 25.7 pg — ABNORMAL LOW (ref 26.0–34.0)
MCHC: 30 g/dL (ref 30.0–36.0)
MCV: 85.7 fL (ref 80.0–100.0)
Platelets: 279 10*3/uL (ref 150–400)
RBC: 4.75 MIL/uL (ref 3.87–5.11)
RDW: 15.7 % — ABNORMAL HIGH (ref 11.5–15.5)
WBC: 4.8 10*3/uL (ref 4.0–10.5)
nRBC: 0 % (ref 0.0–0.2)

## 2018-08-31 LAB — TROPONIN I: Troponin I: <0.03 ng/mL (ref <0.03)

## 2018-08-31 LAB — POC URINE PREG, ED: Preg Test, Ur: NEGATIVE

## 2018-08-31 IMAGING — CR CHEST - 2 VIEW
2 series · 2 of 2 positions shown · non-contrast
Comparison: [DATE]

CLINICAL DATA: Shortness of breath with cough worse over the last
3-4 days.

EXAM:
CHEST - 2 VIEW

[w chest pa]
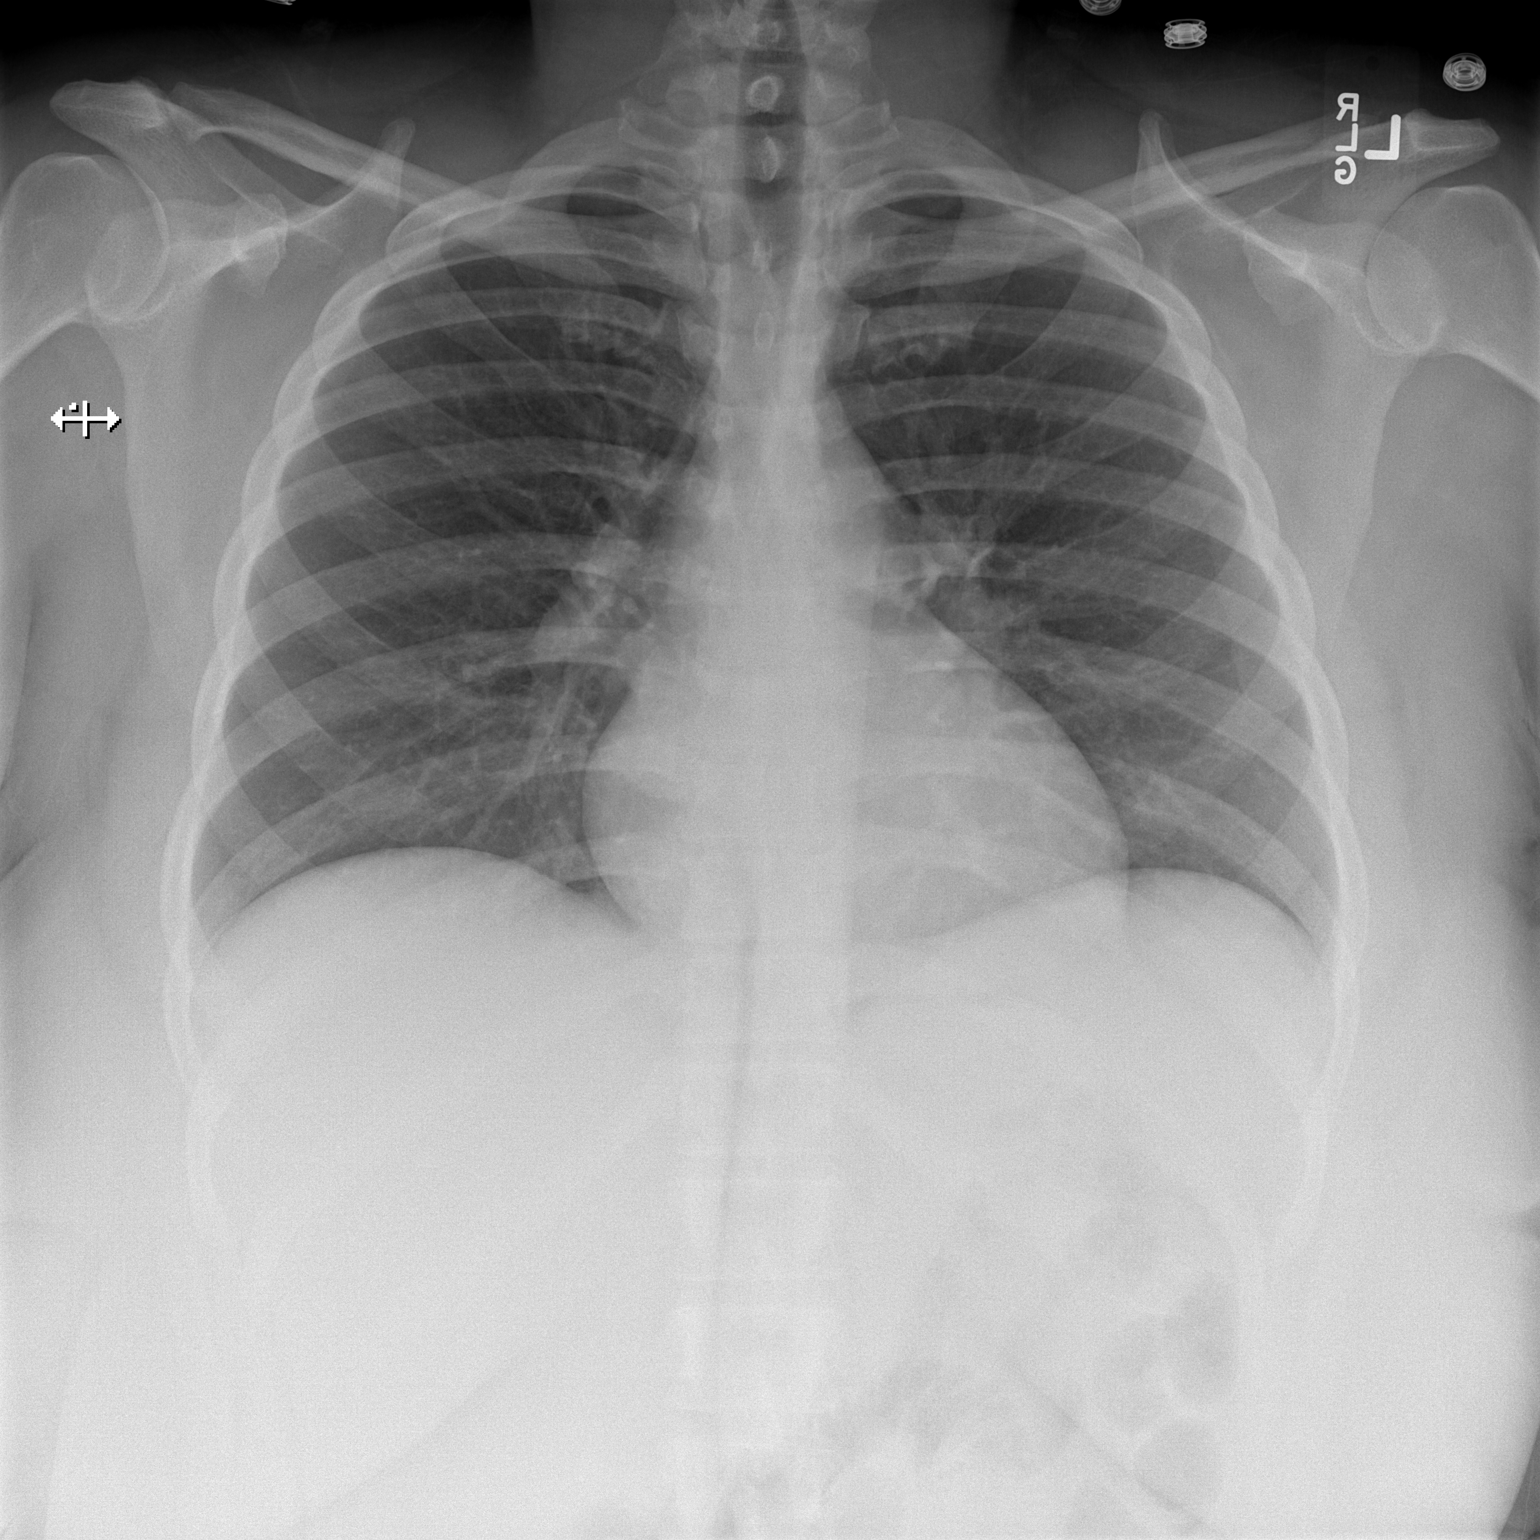

[w chest lat]
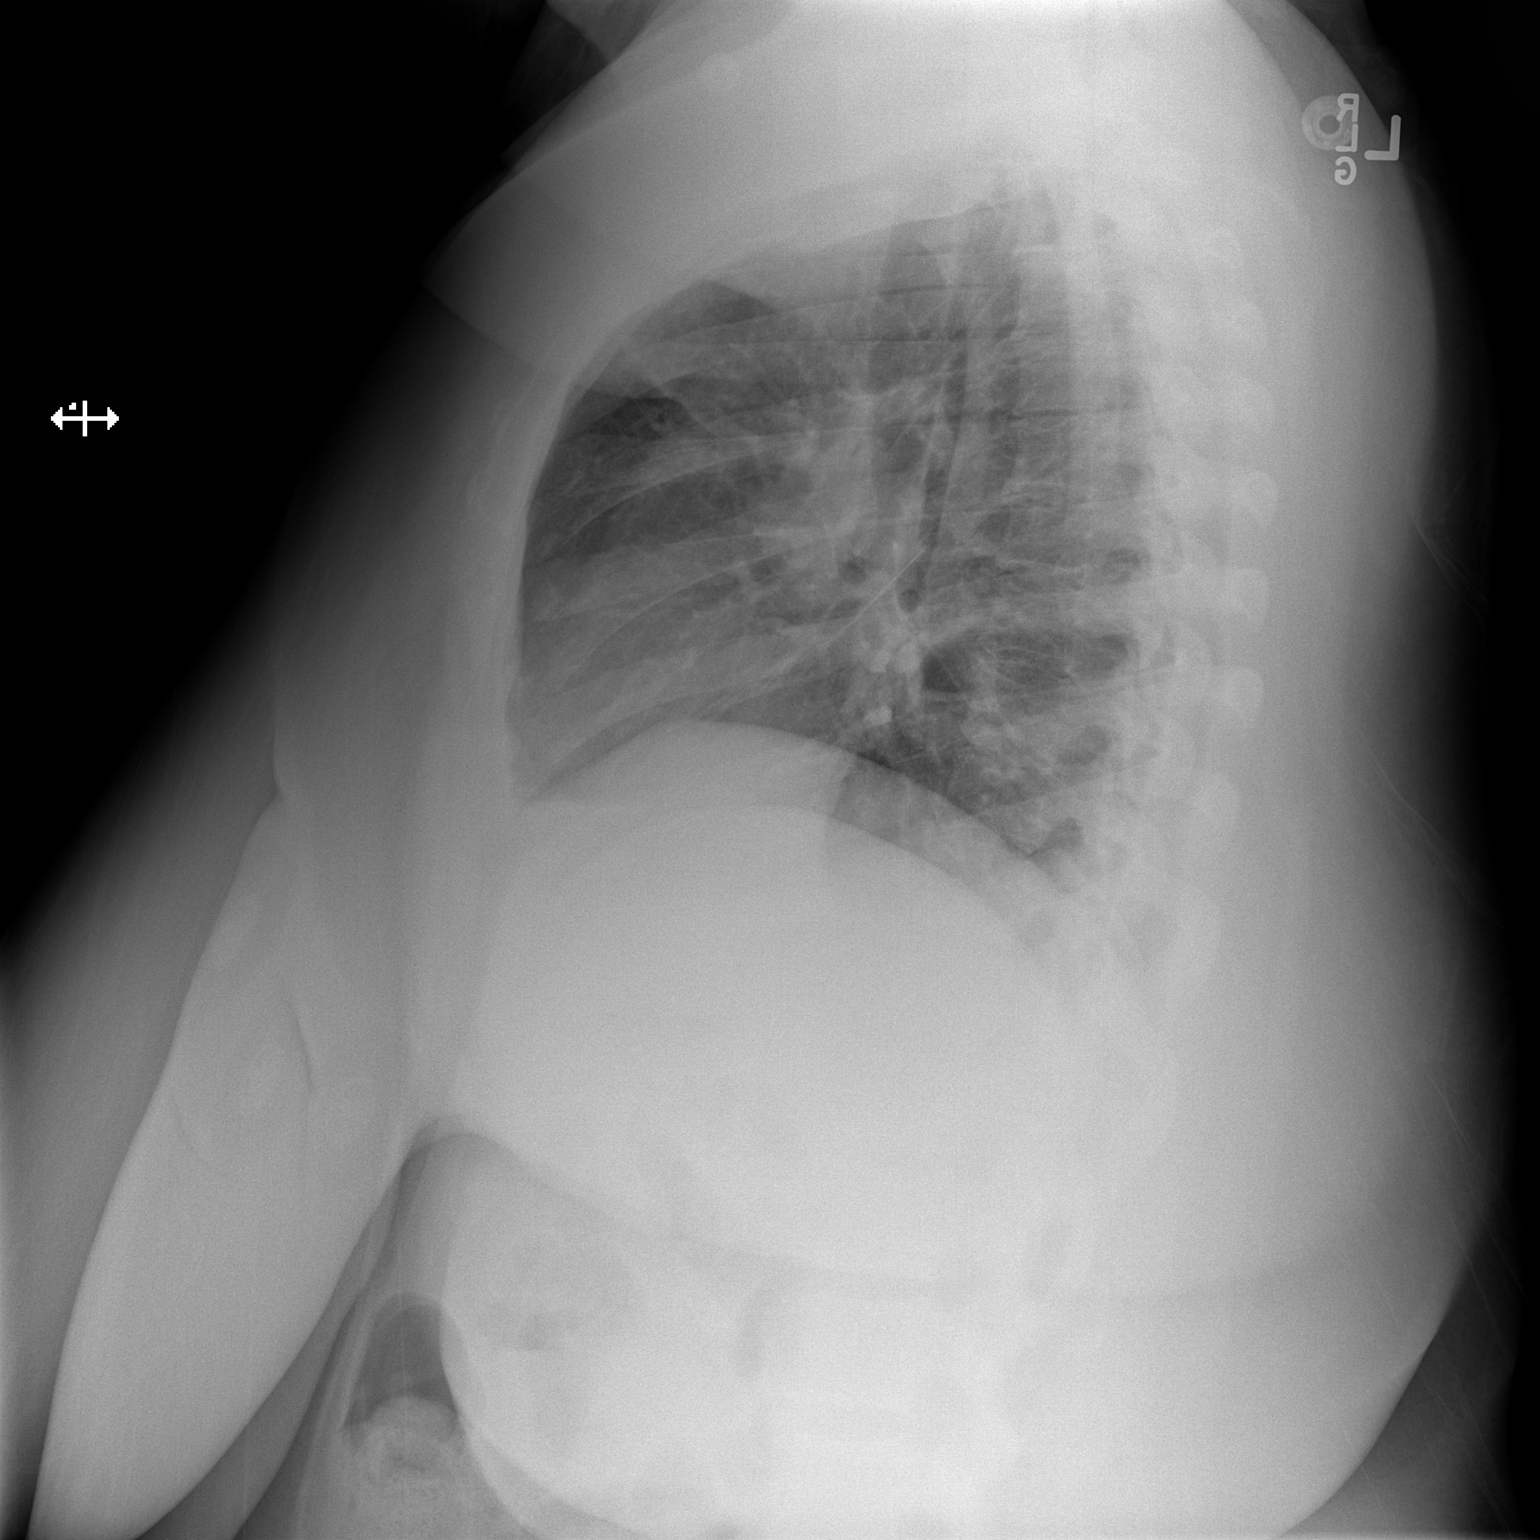

[2 of 2 positions shown; findings below may reference images not displayed]

FINDINGS: Lungs are clear. Cardiomediastinal silhouette is normal. Bony
structures are normal. Prominent overlying soft tissues.
IMPRESSION: No active cardiopulmonary disease.

## 2018-08-31 MED ORDER — PREDNISONE 10 MG PO TABS: 40.0000 mg | ORAL_TABLET | Freq: Every day | ORAL | 0 refills | Status: AC

## 2018-08-31 MED ORDER — ALBUTEROL SULFATE HFA 108 (90 BASE) MCG/ACT IN AERS
2.0000 | INHALATION_SPRAY | Freq: Once | RESPIRATORY_TRACT | Status: AC
  Administered 2018-08-31: 2 via RESPIRATORY_TRACT
  Filled 2018-08-31: qty 6.7

## 2018-08-31 MED ORDER — AZITHROMYCIN 250 MG PO TABS: 250.0000 mg | ORAL_TABLET | Freq: Every day | ORAL | 0 refills | Status: DC

## 2018-08-31 NOTE — ED Provider Notes (Signed)
[facility]-EMERGENCY DEPT Provider Note   CSN: [id number] Arrival date & time: 08/31/18  1048    History   Chief Complaint Chief Complaint  Patient presents with  . Shortness of Breath    HPI Emily Ochoa is a 38 y.o. female.     HPI   38 yo F with PMHx below here with SOB. Pt states that for the past 4-5 days, she's had progressively worsening, mild to moderate SOB. She's had a mild dry cough and SOB, worse w/ exertion and when trying to speak in long sentences. Her cough has been persistent since she had flu several months ago. No fever. No sputum production. No one else in the house is sick. She denies pulmonary history but has had to use albuterol in the past. No tobacco use. No OCP use, CP, pleurisy, recent immobilization, or unilateral leg swelling. No rash. No recent travel. No alleviating factors.  Past Medical History:  Diagnosis Date  . Depression   . Fibromyalgia   . Hypertension   . URI (upper respiratory infection)     Patient Active Problem List   Diagnosis Date Noted  . GERD (gastroesophageal reflux disease) 11/04/2014  . Elevated blood sugar 11/03/2014  . Morbid obesity (HCC) 11/03/2014  . Depression 11/03/2014  . Bulging lumbar disc 11/03/2014    Past Surgical History:  Procedure Laterality Date  . BACK SURGERY     bioposy in 2009     OB History   No obstetric history on file.      Home Medications    Prior to Admission medications   Medication Sig Start Date End Date Taking? Authorizing Provider  ferrous sulfate (IRON SUPPLEMENT) 325 (65 FE) MG tablet Take 325 mg by mouth 2 (two) times daily with a meal.   Yes [provider]  folic acid (FOLVITE) 800 MCG tablet Take 400 mcg by mouth daily.   Yes [provider]  ibuprofen (ADVIL,MOTRIN) 200 MG tablet Take 800 mg by mouth daily as needed for headache or moderate pain.    Yes [provider]  NALTREXONE HCL PO Take 1 tablet by mouth daily.  Pt states this is 4.5mg    Yes [provider]  naproxen (NAPROSYN) 500 MG tablet Take 500 mg by mouth 2 (two) times daily as needed for mild pain.   Yes [provider]  Omega 3 1200 MG CAPS Take 1,200 mg by mouth 2 (two) times daily.   Yes [provider]  pantoprazole (PROTONIX) 40 MG tablet Take 40 mg by mouth 2 (two) times daily.   Yes [provider]  propranolol (INDERAL) 80 MG tablet Take 80 mg by mouth daily.   Yes [provider]  topiramate (TOPAMAX) 25 MG tablet Take 75 mg by mouth at bedtime.   Yes [provider]  azithromycin (ZITHROMAX) 250 MG tablet Take 1 tablet (250 mg total) by mouth daily. Take first 2 tablets together, then 1 every day until finished. 08/31/18   Shaune PollackIsaacs, Ben Habermann, MD  predniSONE (DELTASONE) 10 MG tablet Take 4 tablets (40 mg total) by mouth daily for 5 days. 08/31/18 09/05/18  Shaune PollackIsaacs, Evamae Rowen, MD    Family History Family History  Problem Relation Age of Onset  . Diabetes Sister   . Hypertension Mother   . Diabetes Father   . Hyperlipidemia Father   . Birth defects Maternal Grandfather     Social History Social History   Tobacco Use  . Smoking status: Never Smoker  .  Smokeless tobacco: Never Used  Substance Use Topics  . Alcohol use: No  . Drug use: No     Allergies   Latex; Shellfish allergy; Wheat bran; Barley grass; and Gramineae pollens   Review of Systems Review of Systems  Constitutional: Positive for fatigue. Negative for chills and fever.  HENT: Negative for congestion and rhinorrhea.   Eyes: Negative for visual disturbance.  Respiratory: Positive for shortness of breath. Negative for cough and wheezing.   Cardiovascular: Negative for chest pain and leg swelling.  Gastrointestinal: Negative for abdominal pain, diarrhea, nausea and vomiting.  Genitourinary: Negative for dysuria and flank pain.  Musculoskeletal: Negative for neck pain and neck stiffness.  Skin: Negative for rash and  wound.  Allergic/Immunologic: Negative for immunocompromised state.  Neurological: Negative for syncope, weakness and headaches.  All other systems reviewed and are negative.    Physical Exam Updated Vital Signs BP 130/87 (BP Location: Left Arm)   Pulse 67   Temp 98.7 F (37.1 C) (Oral)   Resp 10   Ht 5\' 4"  (1.626 m)   Wt (!) 140.6 kg   LMP 07/30/2018   SpO2 100%   BMI 53.21 kg/m   Physical Exam Vitals signs and nursing note reviewed.  Constitutional:      General: She is not in acute distress.    Appearance: She is well-developed.  HENT:     Head: Normocephalic and atraumatic.  Eyes:     Conjunctiva/sclera: Conjunctivae normal.  Neck:     Musculoskeletal: Neck supple.  Cardiovascular:     Rate and Rhythm: Normal rate and regular rhythm.     Heart sounds: Normal heart sounds. No murmur. No friction rub.  Pulmonary:     Effort: Pulmonary effort is normal. No respiratory distress.     Breath sounds: Wheezing (scant, expiratory only) present. No rales.  Abdominal:     General: There is no distension.     Palpations: Abdomen is soft.     Tenderness: There is no abdominal tenderness.  Musculoskeletal:     Right lower leg: She exhibits no tenderness. No edema.     Left lower leg: She exhibits no tenderness. No edema.  Skin:    General: Skin is warm.     Capillary Refill: Capillary refill takes less than 2 seconds.  Neurological:     Mental Status: She is alert and oriented to person, place, and time.     Motor: No abnormal muscle tone.      ED Treatments / Results  Labs (all labs ordered are listed, but only abnormal results are displayed) Labs Reviewed  CBC - Abnormal; Notable for the following components:      Result Value   MCH 25.7 (*)    RDW 15.7 (*)    All other components within normal limits  BASIC METABOLIC PANEL - Abnormal; Notable for the following components:   Chloride 113 (*)    CO2 21 (*)    Anion gap 4 (*)    All other components within  normal limits  TROPONIN I  POC URINE PREG, ED    EKG EKG Interpretation  Date/Time:  Monday Aug 31 2018 11:20:19 EDT Ventricular Rate:  72 PR Interval:    QRS Duration: 94 QT Interval:  387 QTC Calculation: 424 R Axis:   64 Text Interpretation:  Sinus rhythm No significant change since last tracing Confirmed by Shaune Pollack [phone]) on 08/31/2018 11:23:05 AM   Radiology Dg Chest 2 View  Result Date: 08/31/2018 CLINICAL  DATA:  Shortness of breath with cough worse over the last 3-4 days. EXAM: CHEST - 2 VIEW COMPARISON:  07/18/2018 FINDINGS: Lungs are clear. Cardiomediastinal silhouette is normal. Bony structures are normal. Prominent overlying soft tissues. IMPRESSION: No active cardiopulmonary disease. Electronically Signed   By: Elberta Fortis M.D.   On: 08/31/2018 12:30    Procedures Procedures (including critical care time)  Medications Ordered in ED Medications  albuterol (VENTOLIN HFA) 108 (90 Base) MCG/ACT inhaler 2 puff (2 puffs Inhalation Given 08/31/18 1233)     Initial Impression / Assessment and Plan / ED Course  I have reviewed the triage vital signs and the nursing notes.  Pertinent labs & imaging results that were available during my care of the patient were reviewed by me and considered in my medical decision making (see chart for details).  Clinical Course as of Aug 30 1300  Mon Aug 31, 2018  2637 38 year old female here with shortness of breath and cough.  Suspect this is either allergy related versus URI.  She has no fever, focal lung findings, hypoxia, or symptoms to suggest significant pneumonia or SARS-CoV-2 infection, but will follow-up chest x-ray.  She is not tachycardic, tachypneic, hypoxic, and is PERC negative and no signs of DVT clinically, doubt PE.  EKG nonischemic with no history of coronary disease and I do not suspect ACS or dissection.  Will follow-up screening labs, chest x-ray, and trial albuterol inhaler as she is of benefit from this in the  past.   [CI]  1212 No anemia or leukocytosis  CBC(!) [CI]  1258 Chest x-ray reviewed and is clear.  Her lab work is very reassuring.  She has some mild improvement after breathing treatment.  I suspect she has a component of underlying allergies with possible allergic component.  Will treat as such, have her use inhaler at home, and discharged with outpatient follow-up.   [CI]  1258 Reassuring with normal EKG and constant symptoms, making ACS unlikely.  Troponin I - ONCE - STAT [CI]  1258 Setting her percent on room air.  Normal work of breathing and respiratory rate.  SpO2: 100 % [CI]    Clinical Course User Index [CI] Shaune Pollack, MD         Final Clinical Impressions(s) / ED Diagnoses   Final diagnoses:  SOB (shortness of breath)  Mild intermittent reactive airway disease with acute exacerbation    ED Discharge Orders         Ordered    predniSONE (DELTASONE) 10 MG tablet  Daily     08/31/18 1301    azithromycin (ZITHROMAX) 250 MG tablet  Daily     08/31/18 1301           Shaune Pollack, MD 08/31/18 1302

## 2018-08-31 NOTE — Discharge Instructions (Addendum)
I suspect your shortness of breath is due to multiple factors.  Fortunately, your x-ray and labs today showed no signs of abnormality  For now, we will treat you with steroids and an antibiotic for possible "walking pneumonia" and/or bronchitis. I also recommend using the ALBUTEROL INHALER  Use the inhaler - 2 puffs - every 4-6 hours as needed for wheezing or SOB

## 2018-08-31 NOTE — ED Notes (Signed)
ED Provider at bedside. ISAACS 

## 2018-08-31 NOTE — ED Notes (Signed)
RX X 2 TO BE PICKED UP

## 2018-08-31 NOTE — ED Triage Notes (Signed)
Pt endorse shortness of breath with upper center chest pain x 3 days. Greater with deep inhalations. Denies fever N/V/D. Pt had flu in February and URI in late March. Pt states has non productive cough. EDP ISAACS evaluated this Clinical research associate upon arrival to room. This Clinical research associate was attended to a critical need of another pt. Pt presents in no acute distress. Speaking in complete sentences. Dee NT present upon pt's arrival

## 2020-05-17 DIAGNOSIS — M899 Disorder of bone, unspecified: Secondary | ICD-10-CM

## 2020-05-17 DIAGNOSIS — M898X9 Other specified disorders of bone, unspecified site: Secondary | ICD-10-CM

## 2020-05-17 HISTORY — DX: Other specified disorders of bone, unspecified site: M89.8X9

## 2020-06-28 ENCOUNTER — Encounter (HOSPITAL_BASED_OUTPATIENT_CLINIC_OR_DEPARTMENT_OTHER): Payer: Self-pay | Admitting: Emergency Medicine

## 2020-06-28 ENCOUNTER — Emergency Department (HOSPITAL_BASED_OUTPATIENT_CLINIC_OR_DEPARTMENT_OTHER)
Admission: EM | Admit: 2020-06-28 | Discharge: 2020-06-29 | Disposition: A | Discharge status: Home / Self Care | Payer: Medicare Other | Attending: Emergency Medicine | Admitting: Emergency Medicine

## 2020-06-28 ENCOUNTER — Other Ambulatory Visit: Payer: Self-pay

## 2020-06-28 DIAGNOSIS — H538 Other visual disturbances: Secondary | ICD-10-CM | POA: Diagnosis present

## 2020-06-28 DIAGNOSIS — Z79899 Other long term (current) drug therapy: Secondary | ICD-10-CM | POA: Insufficient documentation

## 2020-06-28 DIAGNOSIS — I10 Essential (primary) hypertension: Secondary | ICD-10-CM | POA: Diagnosis not present

## 2020-06-28 DIAGNOSIS — H1011 Acute atopic conjunctivitis, right eye: Secondary | ICD-10-CM

## 2020-06-28 DIAGNOSIS — Z91040 Latex allergy status: Secondary | ICD-10-CM | POA: Insufficient documentation

## 2020-06-28 MED ORDER — FLUORESCEIN SODIUM 1 MG OP STRP: 1.0000 | ORAL_STRIP | Freq: Once | OPHTHALMIC | Status: AC

## 2020-06-28 MED ORDER — TETRACAINE HCL 0.5 % OP SOLN
OPHTHALMIC | Status: AC
  Administered 2020-06-28: 2 [drp] via OPHTHALMIC
  Filled 2020-06-28: qty 4

## 2020-06-28 MED ORDER — TETRACAINE HCL 0.5 % OP SOLN: 2.0000 [drp] | Freq: Once | OPHTHALMIC | Status: AC

## 2020-06-28 MED ORDER — FLUORESCEIN SODIUM 1 MG OP STRP
ORAL_STRIP | OPHTHALMIC | Status: AC
  Administered 2020-06-28: 1 via OPHTHALMIC
  Filled 2020-06-28: qty 1

## 2020-06-28 NOTE — ED Triage Notes (Signed)
Pt c/o eye pain, itching, and swelling x 1 hr. Pt states that it started itching while in church and noticed some swelling.  Pt states that she is having some vision changes. Pt aaox3, ambulatory with steady gait, VSS, GCS 15, NAD note.

## 2020-06-29 DIAGNOSIS — H1011 Acute atopic conjunctivitis, right eye: Secondary | ICD-10-CM | POA: Diagnosis not present

## 2020-06-29 MED ORDER — ERYTHROMYCIN 5 MG/GM OP OINT
TOPICAL_OINTMENT | Freq: Three times a day (TID) | OPHTHALMIC | Status: DC
  Filled 2020-06-29: qty 3.5

## 2020-06-29 MED ORDER — NAPHAZOLINE-PHENIRAMINE 0.025-0.3 % OP SOLN: 1.0000 [drp] | Freq: Three times a day (TID) | OPHTHALMIC | 0 refills | Status: DC

## 2020-06-29 NOTE — ED Provider Notes (Signed)
[facility] EMERGENCY DEPARTMENT Provider Note   CSN: [id number] Arrival date & time: 06/28/20  2105     History Chief Complaint  Patient presents with  . Eye Problem    Emily Ochoa is a 40 y.o. female.  The history is provided by the patient.  Eye Problem Location:  Right eye Severity:  Mild Onset quality:  Sudden Duration:  3 hours Timing:  Constant Chronicity:  New Context comment:  Fake eyelashes Relieved by:  None tried Worsened by:  Nothing Associated symptoms: blurred vision, discharge and tearing   Associated symptoms: no decreased vision, no double vision and no headaches   Patient reports she was at church when she had sudden onset of right eye swelling and mild discharge.  She has had very mild pain.  She does not wear contact lenses.  No recent trauma.  She does report she had fake eyelashes in which she has since taken out.  No other new exposures.  No fevers.     Past Medical History:  Diagnosis Date  . Depression   . Fibromyalgia   . Hypertension   . URI (upper respiratory infection)     Patient Active Problem List   Diagnosis Date Noted  . GERD (gastroesophageal reflux disease) 11/04/2014  . Elevated blood sugar 11/03/2014  . Morbid obesity (HCC) 11/03/2014  . Depression 11/03/2014  . Bulging lumbar disc 11/03/2014    Past Surgical History:  Procedure Laterality Date  . BACK SURGERY     bioposy in 2009     OB History   No obstetric history on file.     Family History  Problem Relation Age of Onset  . Diabetes Sister   . Hypertension Mother   . Diabetes Father   . Hyperlipidemia Father   . Birth defects Maternal Grandfather     Social History   Tobacco Use  . Smoking status: Never Smoker  . Smokeless tobacco: Never Used  Substance Use Topics  . Alcohol use: No  . Drug use: No    Home Medications Prior to Admission medications   Medication Sig Start Date End Date Taking? Authorizing Provider   naphazoline-pheniramine (NAPHCON-A) 0.025-0.3 % ophthalmic solution Place 1 drop into the right eye 3 (three) times daily. 06/29/20  Yes Zadie Rhine, MD  azithromycin (ZITHROMAX) 250 MG tablet Take 1 tablet (250 mg total) by mouth daily. Take first 2 tablets together, then 1 every day until finished. 08/31/18   Shaune Pollack, MD  ferrous sulfate (IRON SUPPLEMENT) 325 (65 FE) MG tablet Take 325 mg by mouth 2 (two) times daily with a meal.    [provider]  folic acid (FOLVITE) 800 MCG tablet Take 400 mcg by mouth daily.    [provider]  ibuprofen (ADVIL,MOTRIN) 200 MG tablet Take 800 mg by mouth daily as needed for headache or moderate pain.     [provider]  NALTREXONE HCL PO Take 1 tablet by mouth daily. Pt states this is 4.5mg     [provider]  naproxen (NAPROSYN) 500 MG tablet Take 500 mg by mouth 2 (two) times daily as needed for mild pain.    [provider]  Omega 3 1200 MG CAPS Take 1,200 mg by mouth 2 (two) times daily.    [provider]  pantoprazole (PROTONIX) 40 MG tablet Take 40 mg by mouth 2 (two) times daily.    [provider]  propranolol (INDERAL) 80 MG tablet Take 80 mg by mouth daily.  [provider]  topiramate (TOPAMAX) 25 MG tablet Take 75 mg by mouth at bedtime.    [provider]    Allergies    Latex, Shellfish allergy, Wheat bran, Barley grass, and Gramineae pollens  Review of Systems   Review of Systems  Constitutional: Negative for fever.  Eyes: Positive for blurred vision and discharge. Negative for double vision.  Neurological: Negative for headaches.    Physical Exam Updated Vital Signs BP 132/80 (BP Location: Left Arm)   Pulse 66   Temp 98.2 F (36.8 C) (Oral)   Resp 20   Ht 1.626 m (5\' 4" )   Wt 124.7 kg   LMP 06/12/2020 (Within Days)   SpO2 100%   BMI 47.20 kg/m   Physical Exam CONSTITUTIONAL: Well developed/well nourished HEAD:  Normocephalic/atraumatic EYES: EOMI/PERRL, no corneal haziness.  No foreign bodies.  Significant chemosis noted to the right eye.  No proptosis.  See photo below No corneal abrasions are noted Visual acuity 20/20 ENMT: Mucous membranes moist NECK: supple no meningeal signs NEURO: Pt is awake/alert/appropriate, moves all extremitiesx4.  No facial droop.   EXTREMITIES:full ROM SKIN: warm, color normal PSYCH: no abnormalities of mood noted, alert and oriented to situation      ED Results / Procedures / Treatments   Labs (all labs ordered are listed, but only abnormal results are displayed) Labs Reviewed - No data to display  EKG None  Radiology No results found.  Procedures Procedures   Medications Ordered in ED Medications  erythromycin ophthalmic ointment ( Right Eye Given 06/29/20 0008)  tetracaine (PONTOCAINE) 0.5 % ophthalmic solution 2 drop (2 drops Both Eyes Given by Other 06/28/20 2357)  fluorescein ophthalmic strip 1 strip (1 strip Right Eye Given by Other 06/28/20 2356)    ED Course  I have reviewed the triage vital signs and the nursing notes.     MDM Rules/Calculators/A&P                          Suspect this patient is having a localized allergic reaction from her eyelashes.  Will start topical antihistamines.  We will also add on erythromycin in case there is any infectious component.  Patient is otherwise well-appearing.  Will refer to ophthalmology Final Clinical Impression(s) / ED Diagnoses Final diagnoses:  Allergic conjunctivitis of right eye    Rx / DC Orders ED Discharge Orders         Ordered    naphazoline-pheniramine (NAPHCON-A) 0.025-0.3 % ophthalmic solution  3 times daily        06/29/20 0004           08/29/20, MD 06/29/20 0110

## 2020-09-13 ENCOUNTER — Other Ambulatory Visit: Payer: Self-pay | Admitting: Medical

## 2020-09-13 DIAGNOSIS — R202 Paresthesia of skin: Secondary | ICD-10-CM

## 2020-09-23 ENCOUNTER — Other Ambulatory Visit: Payer: Self-pay

## 2020-09-23 ENCOUNTER — Ambulatory Visit
Admission: RE | Admit: 2020-09-23 | Discharge: 2020-09-23 | Disposition: A | Discharge status: Home / Self Care | Payer: Medicare Other | Source: Ambulatory Visit | Attending: Medical | Admitting: Medical

## 2020-09-23 DIAGNOSIS — R202 Paresthesia of skin: Secondary | ICD-10-CM

## 2020-09-23 IMAGING — MR MR CERVICAL SPINE W/O CM
5 of 6 series · 30 of 48 positions shown · non-contrast
Comparison: None available.

CLINICAL DATA: Initial evaluation for paresthesias, blurry vision,
numbing sensation extending into the posterior lower extremities.

EXAM:
MRI CERVICAL SPINE WITHOUT CONTRAST
TECHNIQUE: Multiplanar, multisequence MR imaging of the cervical spine was
performed. No intravenous contrast was administered.

[Series 5: T2 · sagittal · 3.0mm · 0.55mm/px · 6 of 15 slices shown (1 of 3)]
[im 1/15]
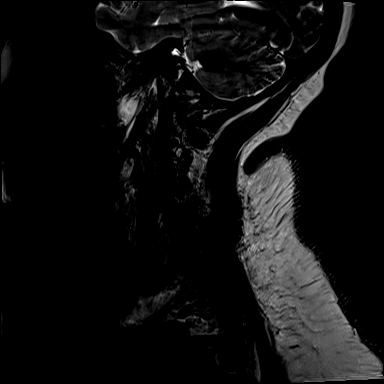
[im 3/15]
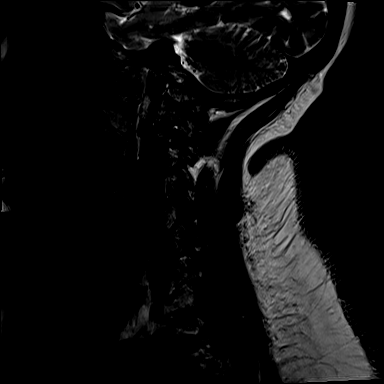
[im 6/15]
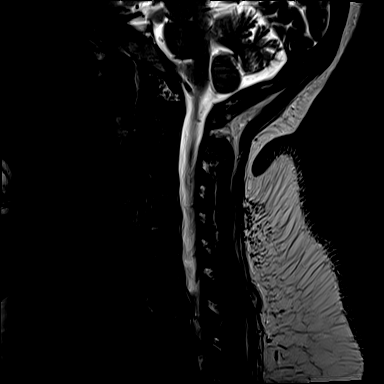
[im 9/15]
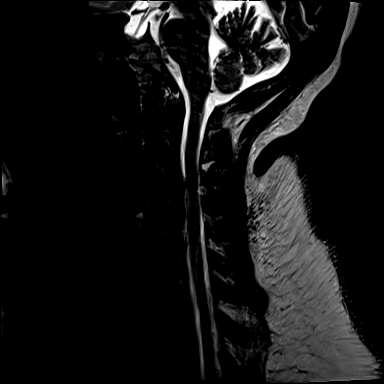
[im 12/15]
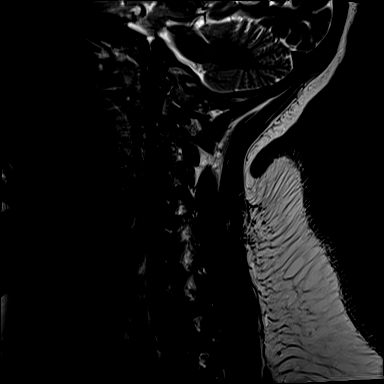
[im 15/15]
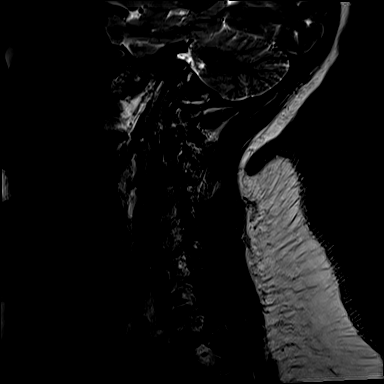

[Series 6: STIR · sagittal · 3.0mm · 0.33mm/px · 3 of 15 slices shown]
[im 1/15]
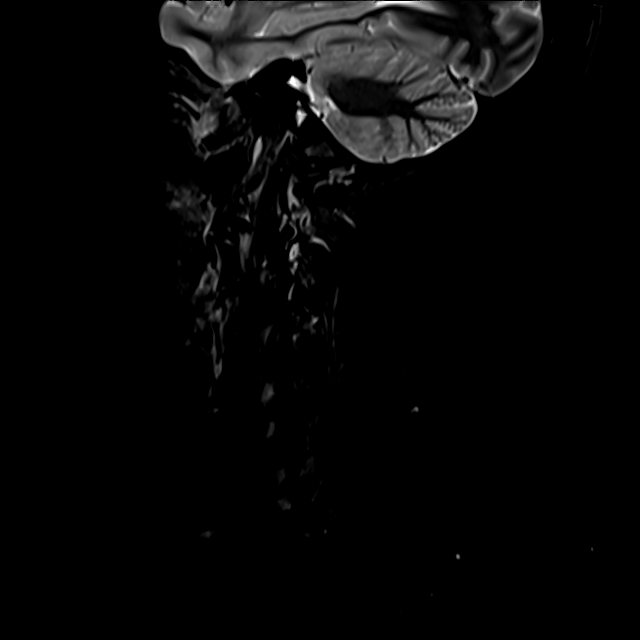
[im 3/15]
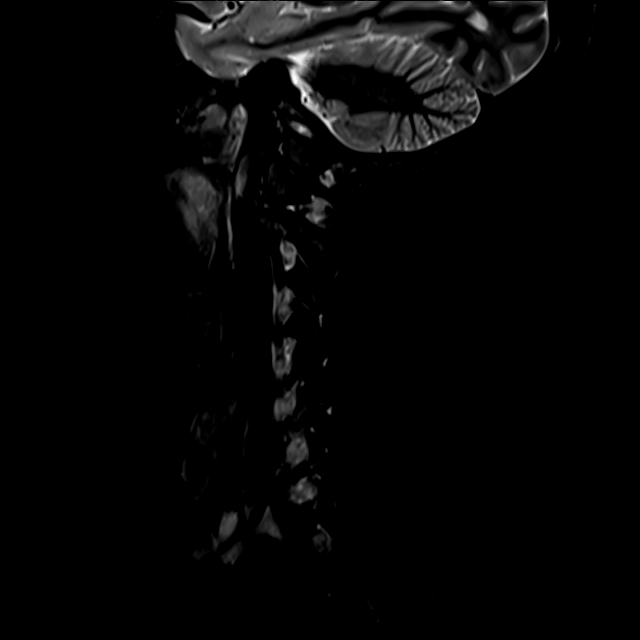
[im 6/15]
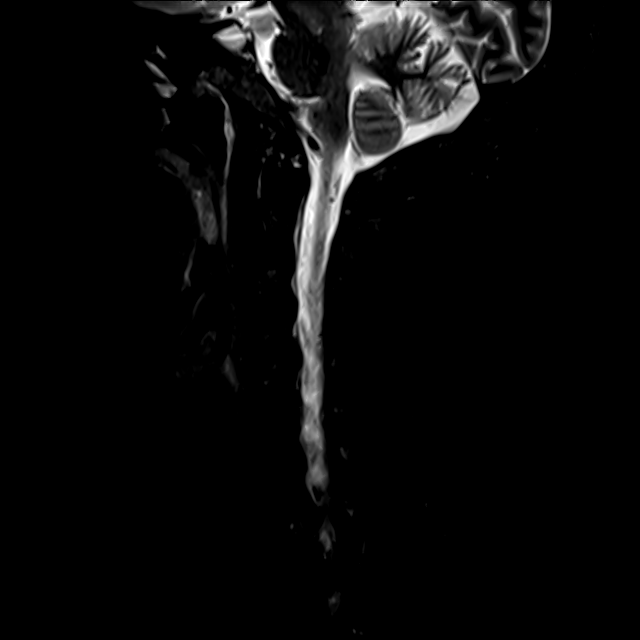

[Series 7: T1 · sagittal · 3.0mm · 0.66mm/px · 6 of 15 slices shown]
[im 1/15]
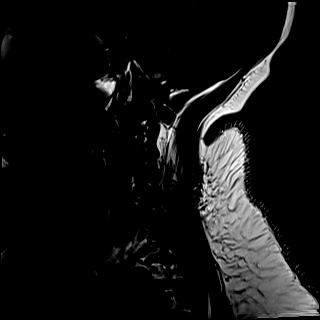
[im 3/15]
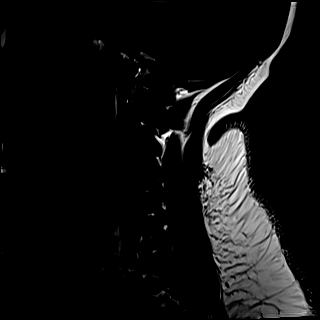
[im 6/15]
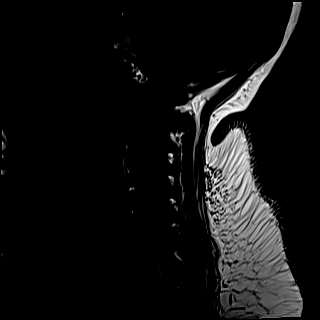
[im 9/15]
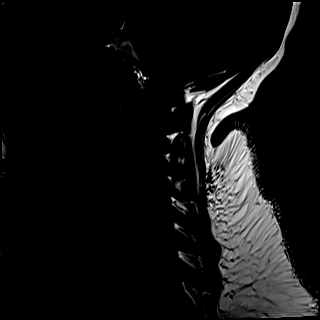
[im 12/15]
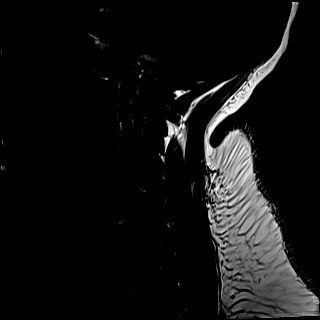
[im 15/15]
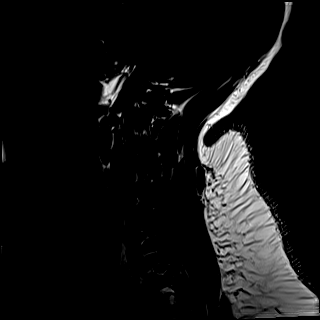

[Series 8: T2 · axial · 3.0mm · 0.50mm/px · z∈[-171,-77]mm · 9 of 30 slices shown (2 of 3)]
[im 1/30]
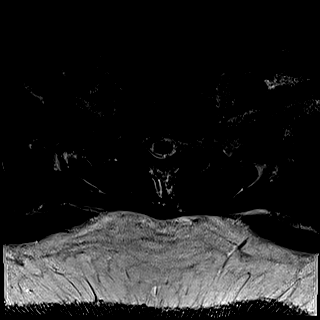
[im 6/30]
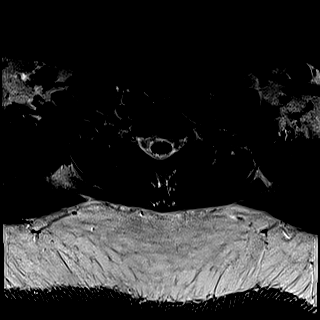
[im 8/30]
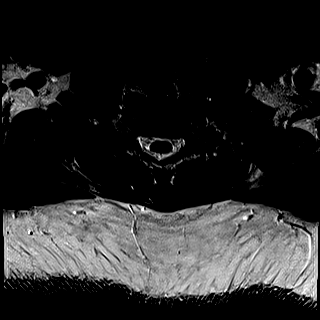
[im 14/30]
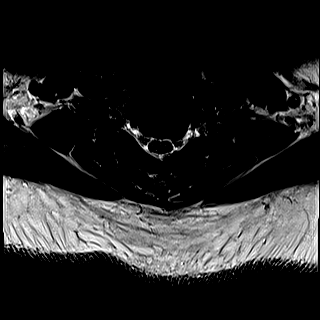
[im 16/30]
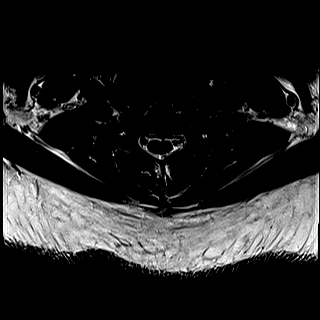
[im 22/30]
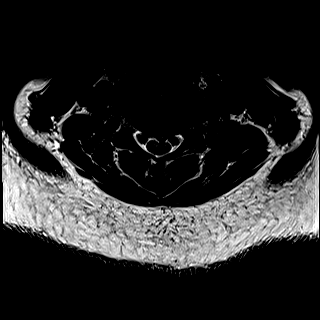
[im 24/30]
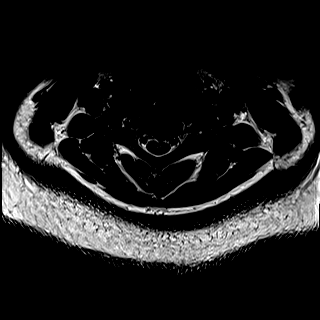
[im 27/30]
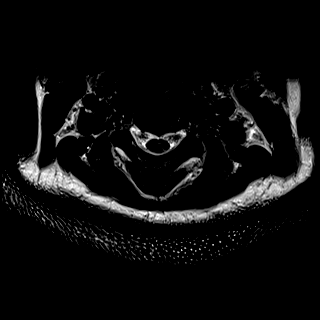
[im 30/30]
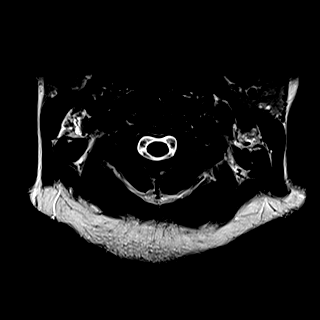

[Series 10: T2 · sagittal · 3.0mm · 0.55mm/px · 6 of 15 slices shown (3 of 3)]
[im 1/15]
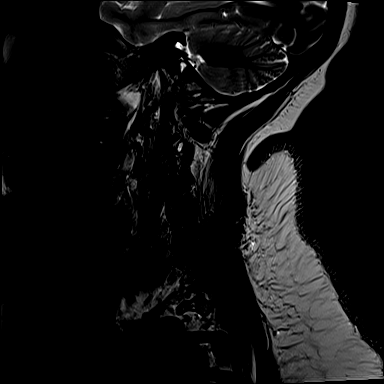
[im 3/15]
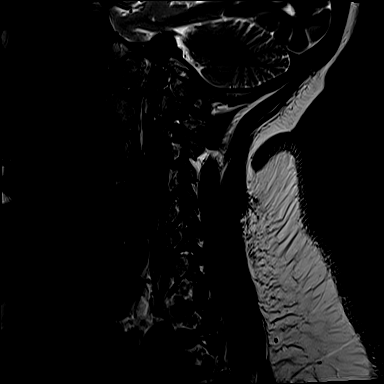
[im 6/15]
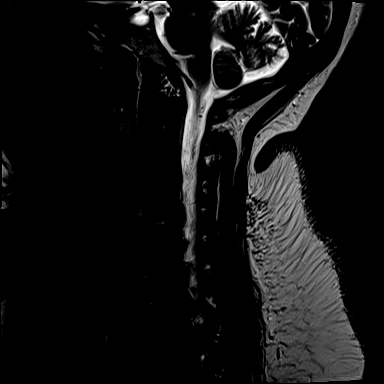
[im 9/15]
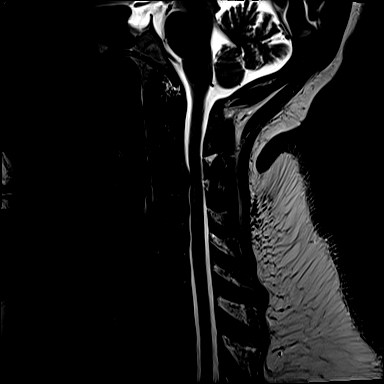
[im 12/15]
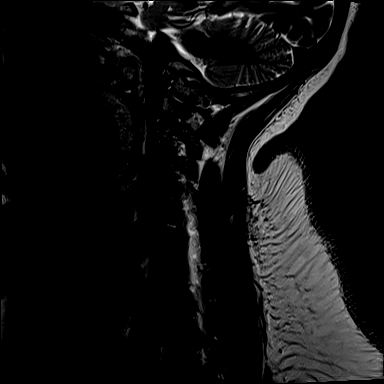
[im 15/15]
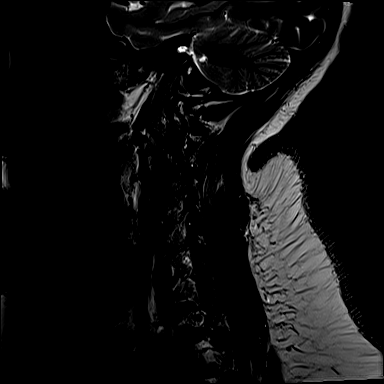

[30 of 48 positions shown; findings below may reference images not displayed]

FINDINGS: Alignment: Examination degraded by motion artifact.

Straightening of the normal cervical lordosis.  No listhesis.

Vertebrae: Vertebral body height maintained without acute or chronic
fracture. Bone marrow signal intensity within normal limits. No
discrete or worrisome osseous lesions. No abnormal marrow edema.

Cord: Normal signal and morphology.

Posterior Fossa, vertebral arteries, paraspinal tissues: Empty sella
noted. Visualized brain and posterior fossa otherwise unremarkable.
Craniocervical junction normal. Paraspinous and prevertebral soft
tissues within normal limits. Normal flow voids seen within the
vertebral arteries bilaterally.

Disc levels:

C2-C3: Unremarkable.

C3-C4: Central disc protrusion indents the ventral thecal sac and
contacts the ventral cord (series 9, image 8). Mild spinal stenosis
with minimal flattening of the ventral cord, asymmetric to the left.
Foramina remain patent.

C4-C5: Mild disc bulge with uncovertebral spurring. Minimal
flattening of the ventral cord without significant spinal stenosis.
Foramina remain patent.

C5-C6: Mild disc bulge with uncovertebral spurring, asymmetric to
the left. Mild flattening of the ventral thecal sac without
significant spinal stenosis. Foramina remain patent.

C6-C7: Minimal disc bulge. No spinal stenosis. Foramina remain
patent.

C7-T1:  Unremarkable.

Visualized upper thoracic spine demonstrates no significant finding.
IMPRESSION: 1. Central disc protrusion at C3-4 with resultant mild spinal
stenosis.
2. Additional mild noncompressive disc bulging at C4-5 through C6-7
without significant stenosis or neural impingement.
3. Empty sella. While this finding is often incidental in nature and
of no clinical significance, this can also be seen in the setting of
idiopathic intracranial hypertension.

## 2020-09-23 IMAGING — MR MR HEAD W/O CM
13 series · 48 of 48 positions shown · non-contrast
Comparison: None.

CLINICAL DATA: Paresthesia.

EXAM:
MRI HEAD WITHOUT CONTRAST
TECHNIQUE: Multiplanar, multiecho pulse sequences of the brain and surrounding
structures were obtained without intravenous contrast.

[Series 5: T1 · sagittal · 4.0mm · 0.75mm/px · 1 of 31 slices shown (1 of 2)]
[im 1/31]
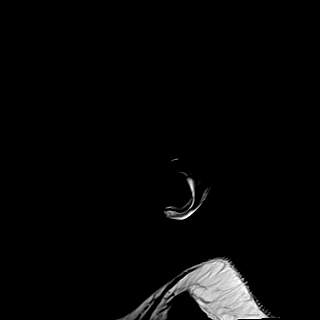

[Series 6: DWI · axial · 3.0mm · 0.94mm/px · z∈[-116,+32]mm · 9 of 167 slices shown (1 of 3)]
[im 1/167]
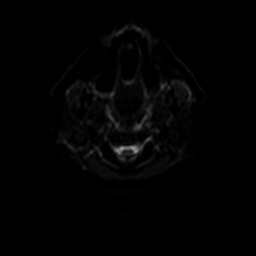
[im 21/167]
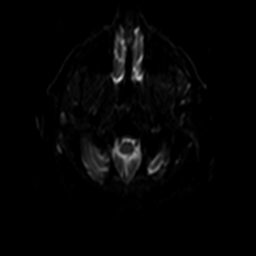
[im 42/167]
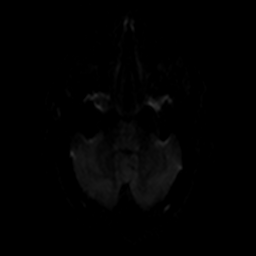
[im 63/167]
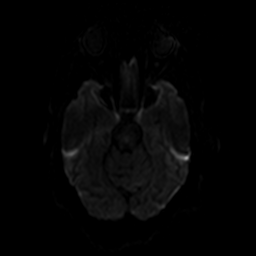
[im 84/167]
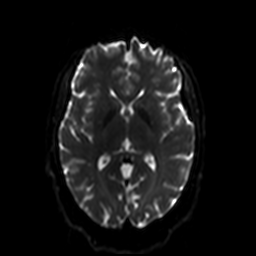
[im 104/167]
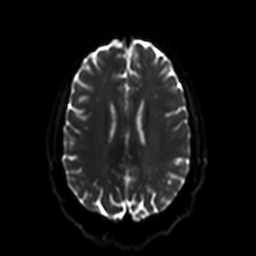
[im 125/167]
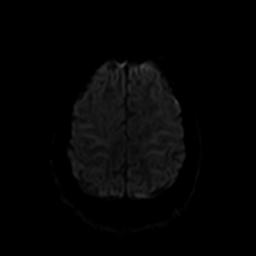
[im 146/167]
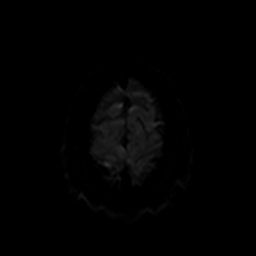
[im 167/167]
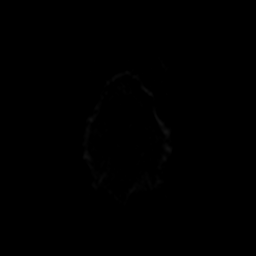

[Series 7: ax dwi_tracew · axial · 3.0mm · 0.94mm/px · z∈[-116,+32]mm · 5 of 84 slices shown]
[im 1/84]
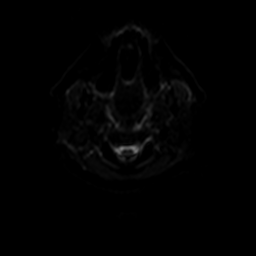
[im 21/84]
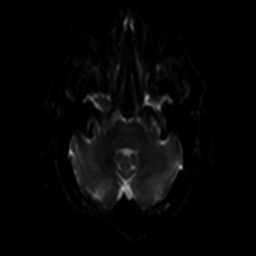
[im 42/84]
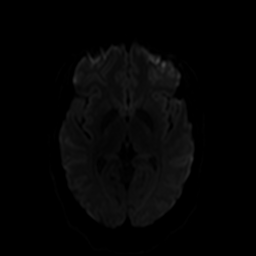
[im 63/84]
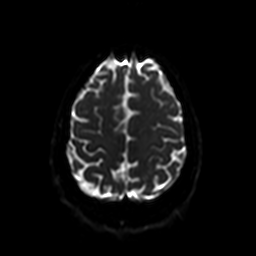
[im 84/84]
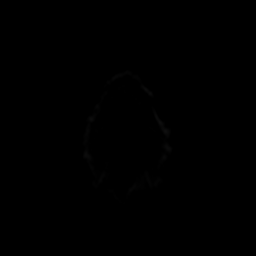

[Series 8: ax dwi_adc · axial · 3.0mm · 0.94mm/px · z∈[-116,+32]mm · 2 of 42 slices shown]
[im 1/42]
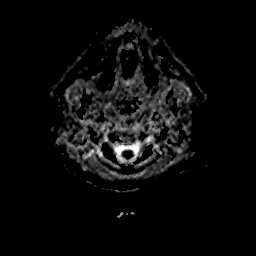
[im 42/42]
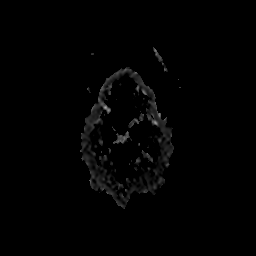

[Series 9: DWI · coronal · 5.0mm · 1.44mm/px · 4 of 66 slices shown (2 of 3)]
[im 1/66]
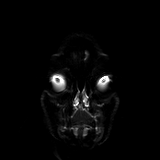
[im 22/66]
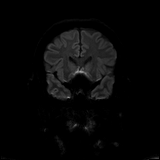
[im 44/66]
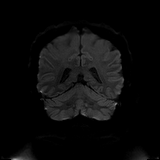
[im 66/66]
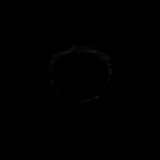

[Series 10: DWI · coronal · 5.0mm · 1.44mm/px · 2 of 33 slices shown (3 of 3)]
[im 1/33]
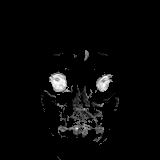
[im 33/33]
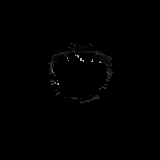

[Series 11: T2 · axial · 4.0mm · 0.36mm/px · z∈[-116,+30]mm · 2 of 29 slices shown (1 of 2)]
[im 1/29]
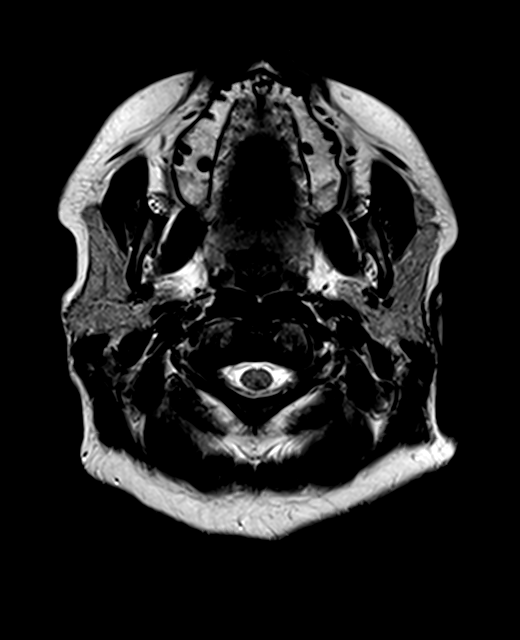
[im 29/29]
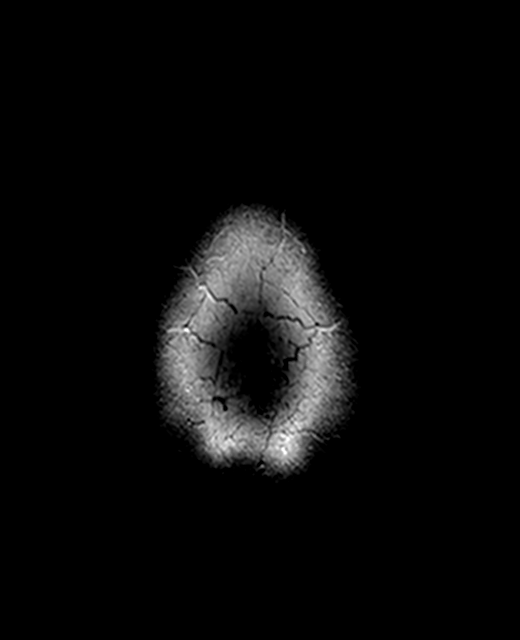

[Series 12: FLAIR · axial · 3.0mm · 0.72mm/px · 1 of 26 slices shown (1 of 2)]
[im 1/26]
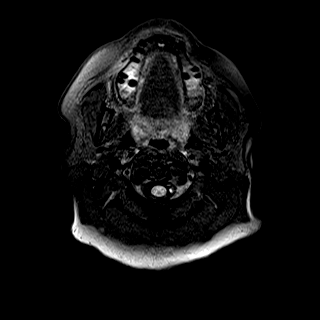

[Series 13: swi_images · axial · 1.5mm · 0.90mm/px · z∈[-114,+28]mm · 5 of 96 slices shown]
[im 1/96]
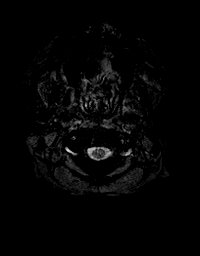
[im 24/96]
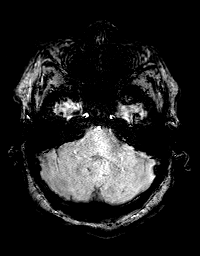
[im 48/96]
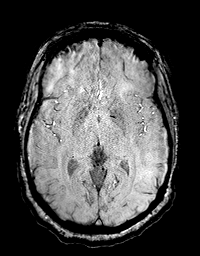
[im 72/96]
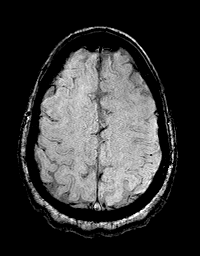
[im 96/96]
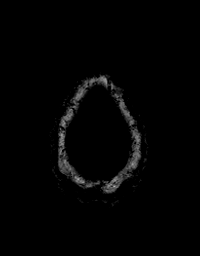

[Series 14: mip_images(sw) · axial · 12.0mm · 0.90mm/px · z∈[-109,+23]mm · 5 of 89 slices shown]
[im 1/89]
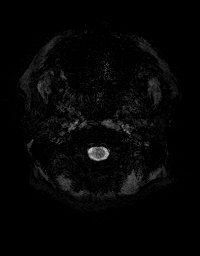
[im 23/89]
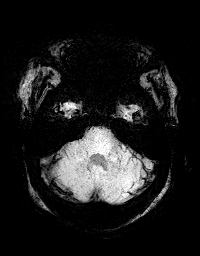
[im 45/89]
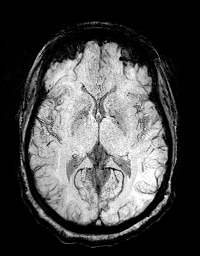
[im 67/89]
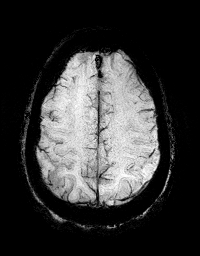
[im 89/89]
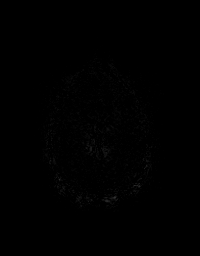

[Series 15: T1 · axial · 1.0mm · 0.94mm/px · z∈[-122,+37]mm · 9 of 160 slices shown (2 of 2)]
[im 1/160]
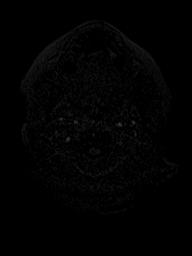
[im 20/160]
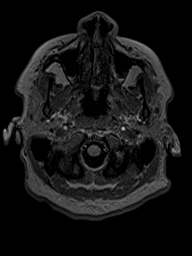
[im 40/160]
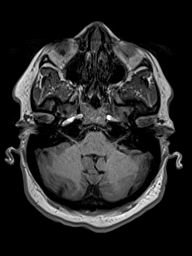
[im 60/160]
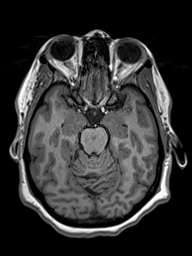
[im 80/160]
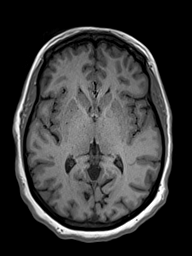
[im 100/160]
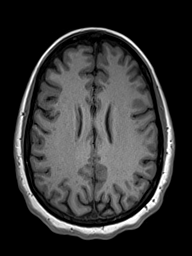
[im 120/160]
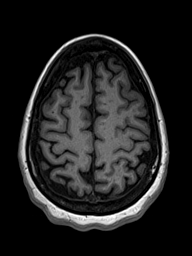
[im 140/160]
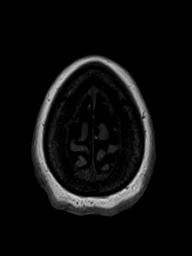
[im 160/160]
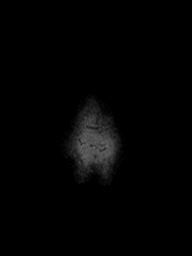

[Series 16: T2 · coronal · 4.5mm · 0.36mm/px · 2 of 33 slices shown (2 of 2)]
[im 1/33]
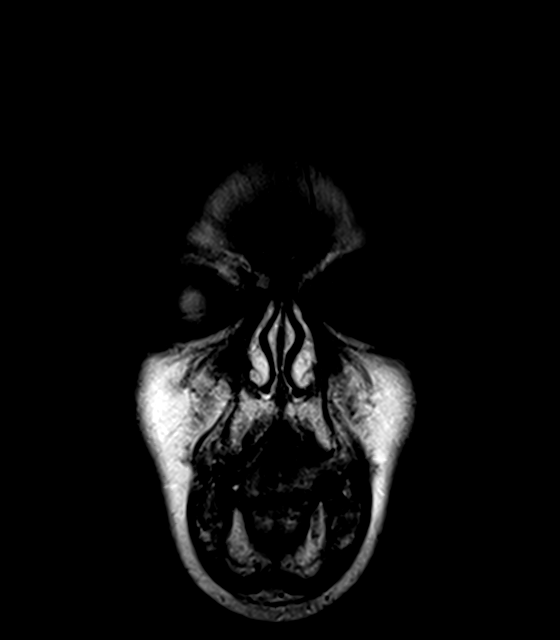
[im 33/33]
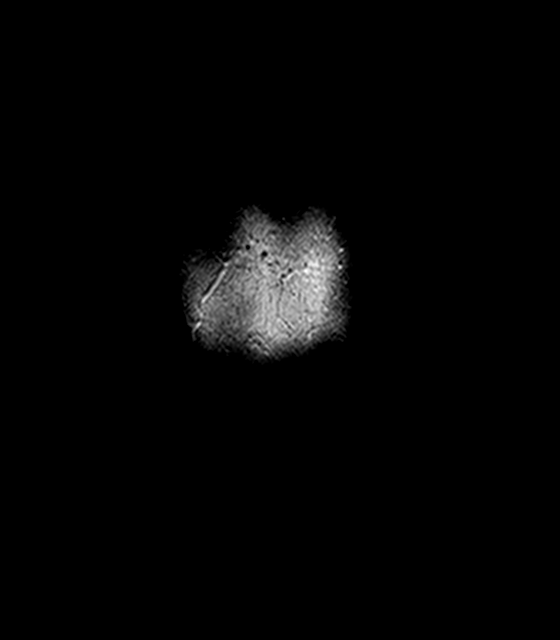

[Series 17: FLAIR · sagittal · 4.0mm · 0.72mm/px · 1 of 27 slices shown (2 of 2)]
[im 1/27]
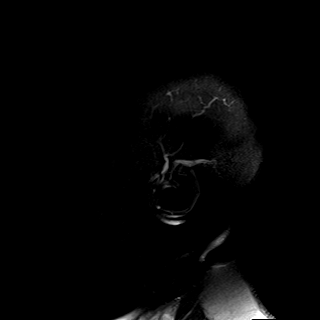

[48 of 48 positions shown; findings below may reference images not displayed]

FINDINGS: Brain: No acute infarction, hemorrhage, hydrocephalus, extra-axial
collection or mass lesion. Scattered foci of T2 hyperintensity are
seen within the white matter of the cerebral hemispheres in a
nonspecific distribution.

Vascular: Normal flow voids.

Skull and upper cervical spine: No focal marrow lesion.

Sinuses/Orbits: Negative.
IMPRESSION: Multiple small T2 hyperintense lesions of the white matter in a
nonspecific distribution. Differential diagnosis include
demyelinating disease, migraine headache, vasculitis, early
microvascular ischemic change, autoimmune disease and other post
inflammatory/infectious processes.

## 2020-11-23 ENCOUNTER — Ambulatory Visit: Payer: Medicare Other | Admitting: Neurology

## 2020-12-26 ENCOUNTER — Emergency Department (HOSPITAL_COMMUNITY): Payer: Medicare Other

## 2020-12-26 ENCOUNTER — Other Ambulatory Visit: Payer: Self-pay

## 2020-12-26 ENCOUNTER — Encounter (HOSPITAL_COMMUNITY): Payer: Self-pay | Admitting: Emergency Medicine

## 2020-12-26 ENCOUNTER — Emergency Department (HOSPITAL_COMMUNITY)
Admission: EM | Admit: 2020-12-26 | Discharge: 2020-12-26 | Disposition: A | Discharge status: Home / Self Care | Payer: Medicare Other | Attending: Student | Admitting: Student

## 2020-12-26 DIAGNOSIS — I10 Essential (primary) hypertension: Secondary | ICD-10-CM | POA: Insufficient documentation

## 2020-12-26 DIAGNOSIS — R0789 Other chest pain: Secondary | ICD-10-CM | POA: Diagnosis present

## 2020-12-26 DIAGNOSIS — M549 Dorsalgia, unspecified: Secondary | ICD-10-CM | POA: Diagnosis not present

## 2020-12-26 DIAGNOSIS — Z91040 Latex allergy status: Secondary | ICD-10-CM | POA: Diagnosis not present

## 2020-12-26 DIAGNOSIS — Z79899 Other long term (current) drug therapy: Secondary | ICD-10-CM | POA: Diagnosis not present

## 2020-12-26 LAB — CBC
HCT: 37.4 % (ref 36.0–46.0)
Hemoglobin: 11.2 g/dL — ABNORMAL LOW (ref 12.0–15.0)
MCH: 24.1 pg — ABNORMAL LOW (ref 26.0–34.0)
MCHC: 29.9 g/dL — ABNORMAL LOW (ref 30.0–36.0)
MCV: 80.6 fL (ref 80.0–100.0)
Platelets: 325 10*3/uL (ref 150–400)
RBC: 4.64 MIL/uL (ref 3.87–5.11)
RDW: 16.8 % — ABNORMAL HIGH (ref 11.5–15.5)
WBC: 8.6 10*3/uL (ref 4.0–10.5)
nRBC: 0 % (ref 0.0–0.2)

## 2020-12-26 LAB — BASIC METABOLIC PANEL
Anion gap: 8 (ref 5–15)
BUN: 13 mg/dL (ref 6–20)
CO2: 27 mmol/L (ref 22–32)
Calcium: 9.7 mg/dL (ref 8.9–10.3)
Chloride: 108 mmol/L (ref 98–111)
Creatinine, Ser: 0.74 mg/dL (ref 0.44–1.00)
GFR, Estimated: >60 mL/min (ref >60)
Glucose, Bld: 101 mg/dL — ABNORMAL HIGH (ref 70–99)
Potassium: 3.7 mmol/L (ref 3.5–5.1)
Sodium: 143 mmol/L (ref 135–145)

## 2020-12-26 LAB — I-STAT BETA HCG BLOOD, ED (MC, WL, AP ONLY): I-stat hCG, quantitative: <5 m[IU]/mL (ref <5)

## 2020-12-26 LAB — TROPONIN I (HIGH SENSITIVITY): Troponin I (High Sensitivity): 2 ng/L (ref <18)

## 2020-12-26 IMAGING — CR DG CHEST 2V
2 series · 2 of 2 positions shown · non-contrast
Comparison: Chest x-ray [DATE]

CLINICAL DATA: Chest pain.

EXAM:
CHEST - 2 VIEW

[w chest pa]
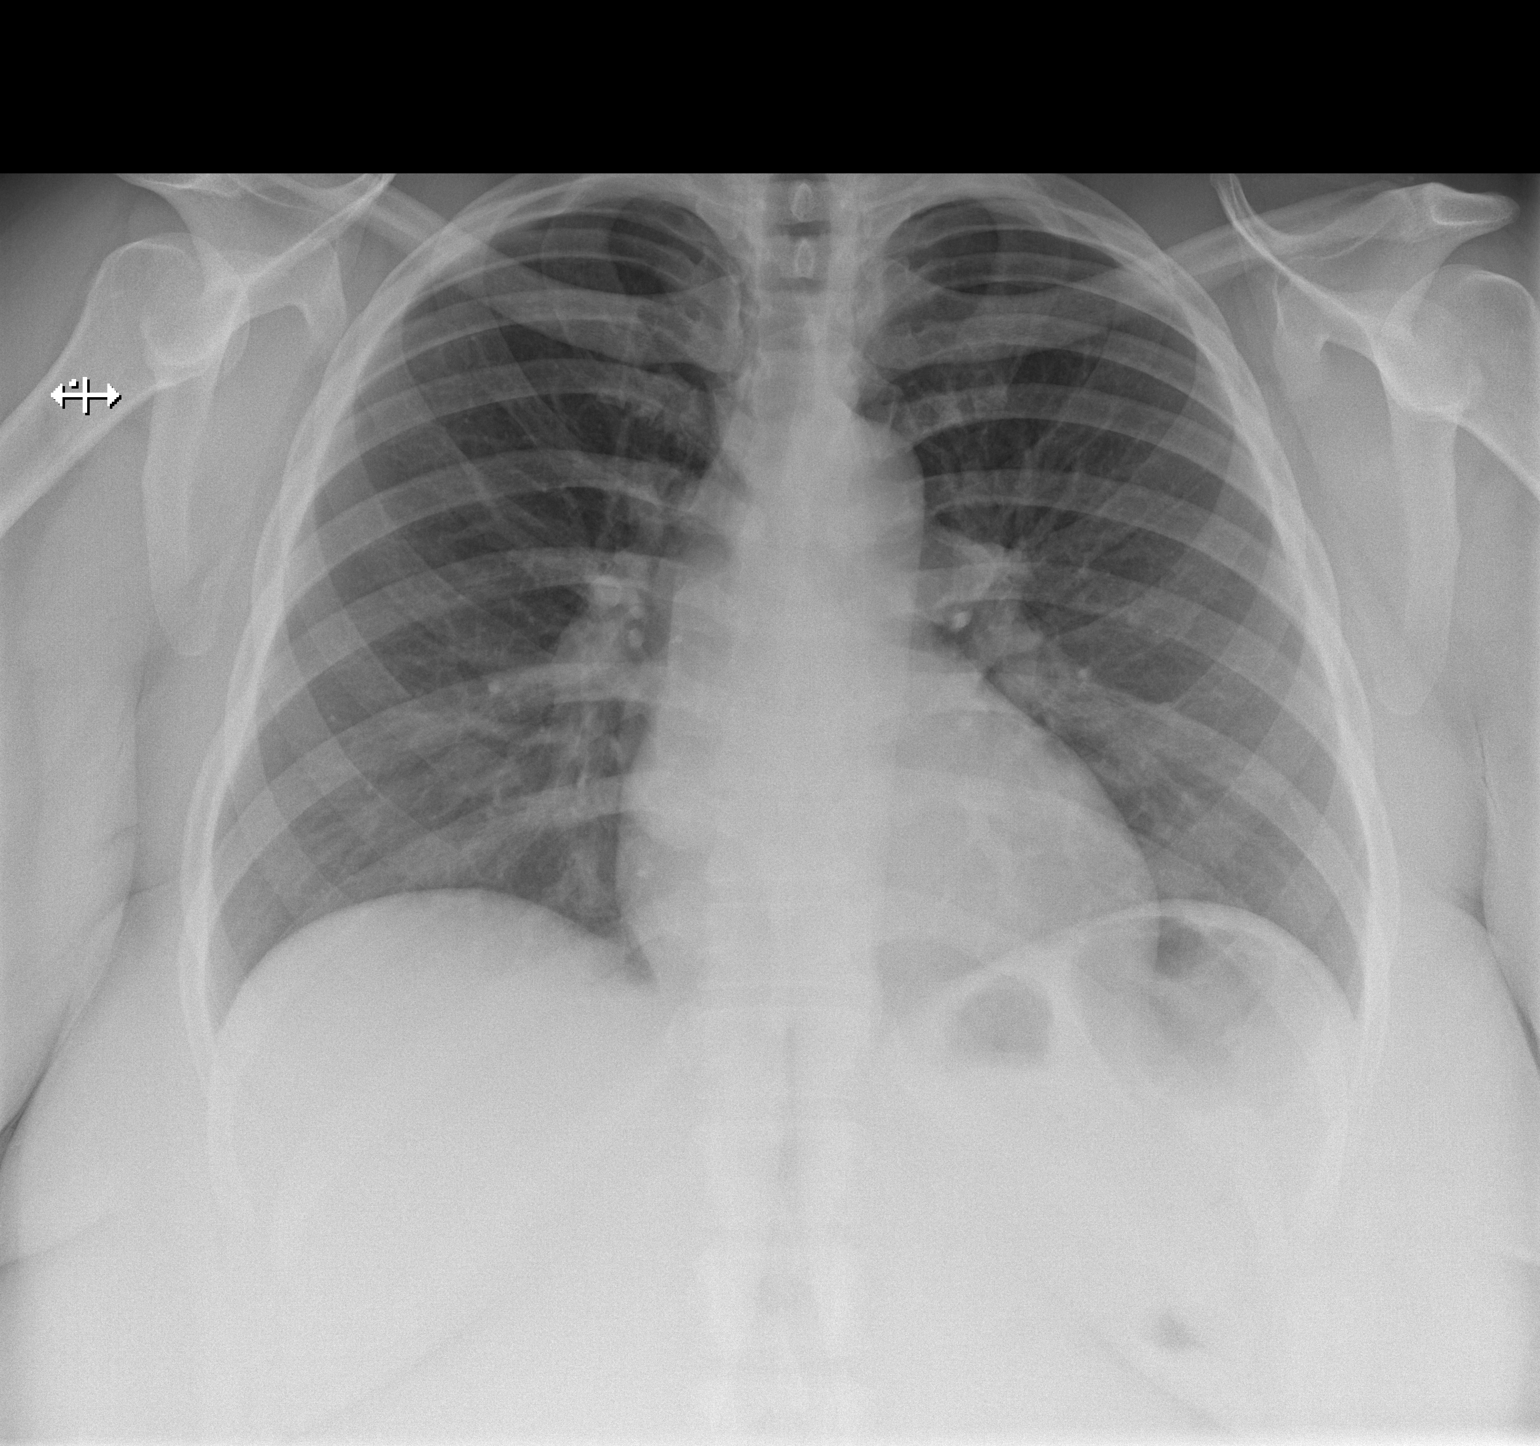

[w chest lat]
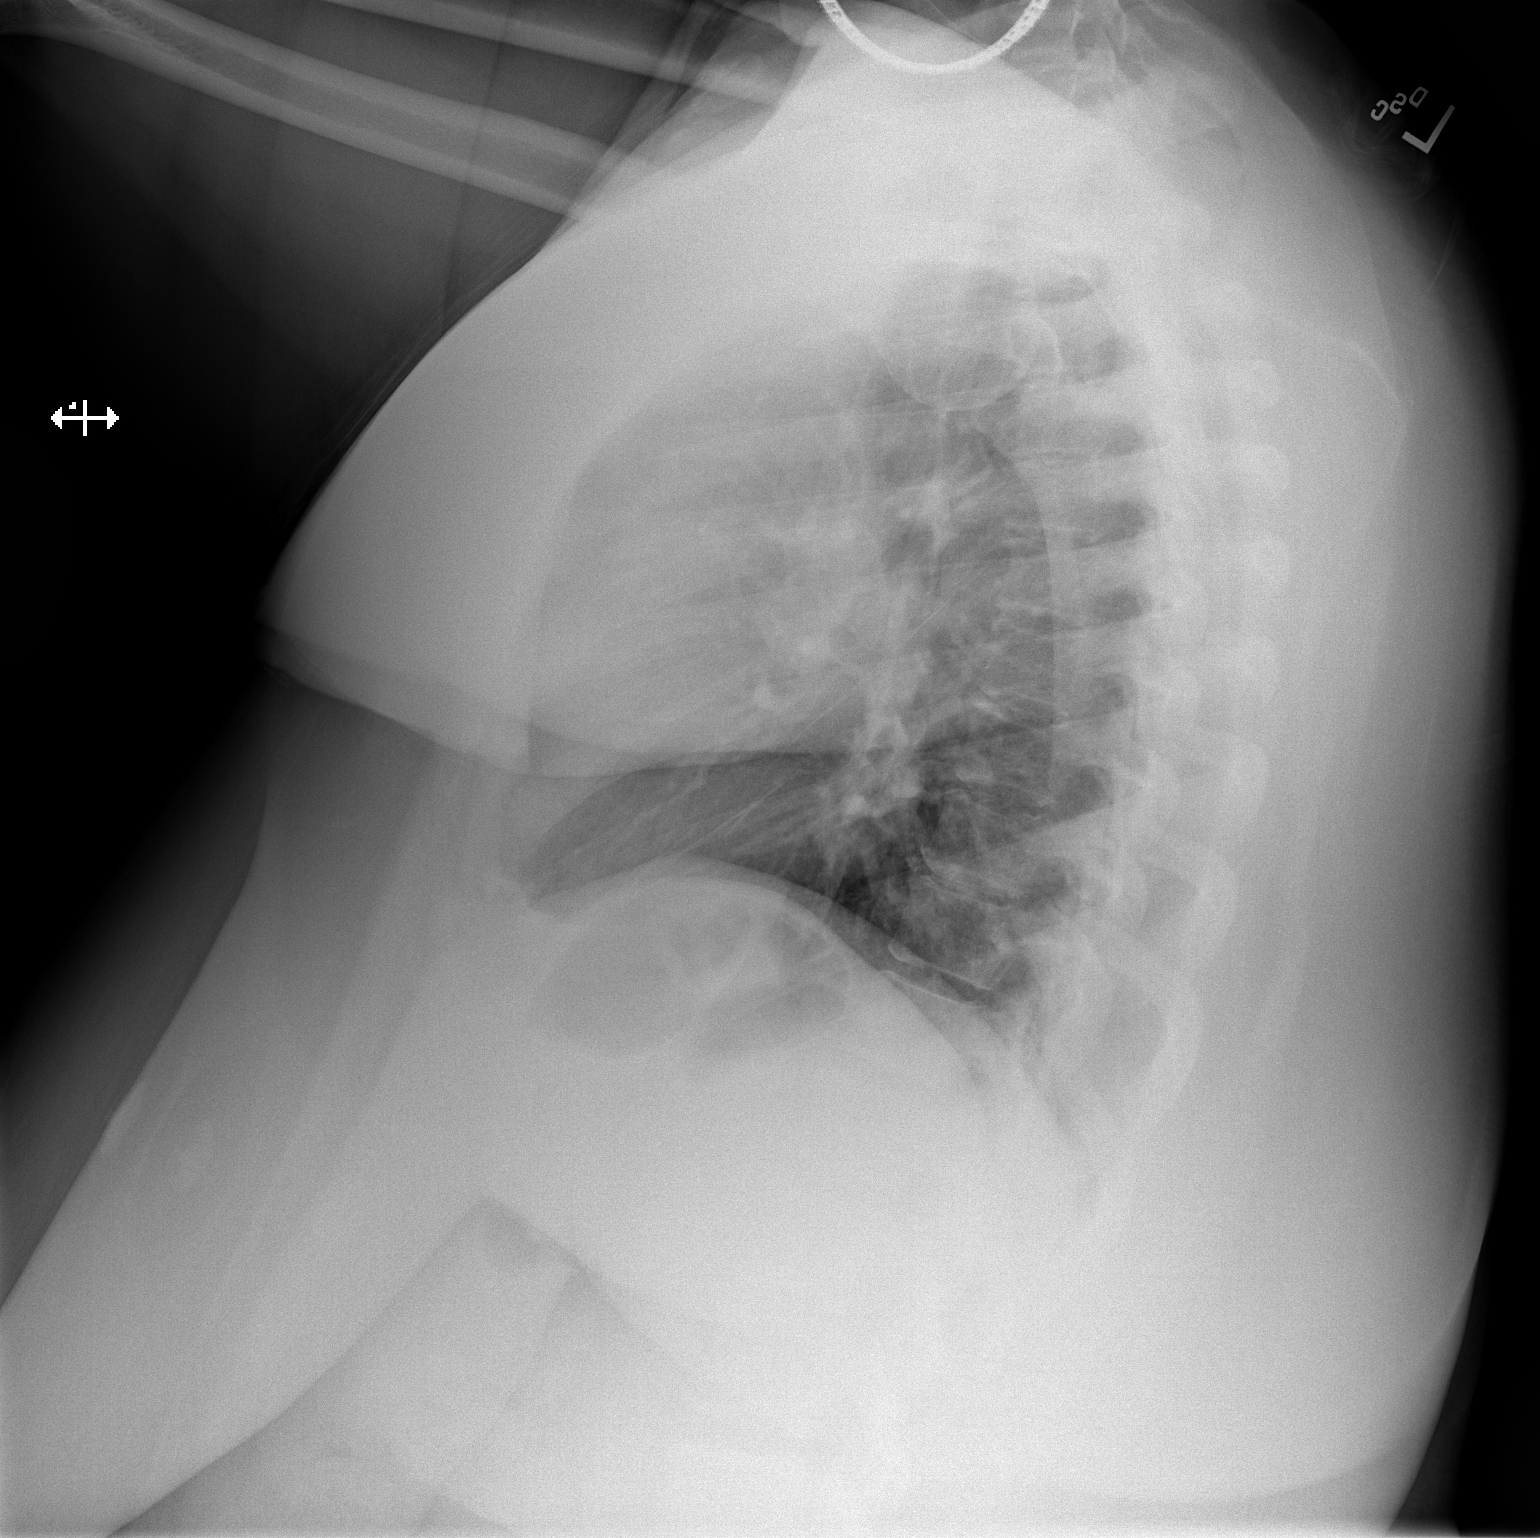

[2 of 2 positions shown; findings below may reference images not displayed]

FINDINGS: The heart and mediastinal contours are within normal limits.

No focal consolidation. No pulmonary edema. No pleural effusion. No
pneumothorax.

No acute osseous abnormality.
IMPRESSION: No active cardiopulmonary disease.

## 2020-12-26 MED ORDER — NAPROXEN 375 MG PO TABS: 375.0000 mg | ORAL_TABLET | Freq: Two times a day (BID) | ORAL | 0 refills | Status: DC

## 2020-12-26 NOTE — ED Triage Notes (Signed)
Per pt, states she has been having left CP radiating to left shoulder pain to back for over a week-has been seen and prescribed steriod

## 2020-12-26 NOTE — Discharge Instructions (Addendum)
Contact a health care provider if: You have a fever. Your chest pain becomes worse. You have new symptoms. Get help right away if: You have nausea or vomiting. You feel sweaty or light-headed. You have a cough with mucus from your lungs (sputum) or you cough up blood. You develop shortness of breath. 

## 2020-12-26 NOTE — ED Provider Notes (Signed)
Emergency Medicine Provider Triage Evaluation Note  Emily Ochoa , a 40 y.o. female  was evaluated in triage.  Pt complains of chest pain, worse with movement, not relieved by rest. She denies radiation. She reports it is sharp in nature. She is not short of breath.She was seen for this issue about a week ago with normal testing, but reports it is worsening.   Review of Systems  Positive: Chest pain, arm pain Negative: Diaphoresis, nausea, shortness of breath  Physical Exam  Pulse 64   Temp 98.5 F (36.9 C) (Oral)   Resp 18   Ht 5\' 4"  (1.626 m)   Wt 133.4 kg   SpO2 100%   BMI 50.46 kg/m  Gen:   Awake, no distress   Resp:  Normal effort  MSK:   Moves extremities without difficulty  Other:    Medical Decision Making  Medically screening exam initiated at 5:13 PM.  Appropriate orders placed.  Uzma Hefel was informed that the remainder of the evaluation will be completed by another provider, this initial triage assessment does not replace that evaluation, and the importance of remaining in the ED until their evaluation is complete.  Chest pain   12/26/20 1714    12/28/20, MD 12/27/20 2218

## 2020-12-26 NOTE — ED Provider Notes (Signed)
[facility]-EMERGENCY DEPT Provider Note   CSN: [id number] Arrival date & time: 12/26/20  1625     History Chief Complaint  Patient presents with   Chest Pain    Lumen Selden is a 40 y.o. female who presents emergency department chief complaint of chest pain and back pain.  Patient states that she slept on her mother's floor about 2 weeks ago, woke up with throbbing left arm pain, left chest wall pain.  Since that time she was seen at an urgent care twice had a chest x-ray and was worked up.  Her arm pain went away.  She was on some anti-inflammatory medications which seem to provide some relief however she has had worsening pain in the left chest which she describes sometimes as sharp.  She no longer has pain radiating down her arm.  She denies shortness of breath.  Pain is worse when she moves the left arm or tries to put her shirt on.   Chest Pain  HPI: A 40 year old patient with a history of obesity presents for evaluation of chest pain. Initial onset of pain was less than one hour ago. The patient's chest pain is not worse with exertion. The patient's chest pain is middle- or left-sided, is not well-localized, is not described as heaviness/pressure/tightness, is not sharp and does not radiate to the arms/jaw/neck. The patient does not complain of nausea and denies diaphoresis. The patient has no history of stroke, has no history of peripheral artery disease, has not smoked in the past 90 days, denies any history of treated diabetes, has no relevant family history of coronary artery disease (first degree relative at less than age 64), is not hypertensive and has no history of hypercholesterolemia.   Past Medical History:  Diagnosis Date   Depression    Fibromyalgia    Hypertension    URI (upper respiratory infection)     Patient Active Problem List   Diagnosis Date Noted   GERD (gastroesophageal reflux disease) 11/04/2014   Elevated blood sugar 11/03/2014    Morbid obesity (HCC) 11/03/2014   Depression 11/03/2014   Bulging lumbar disc 11/03/2014    Past Surgical History:  Procedure Laterality Date   BACK SURGERY     bioposy in 2009     OB History   No obstetric history on file.     Family History  Problem Relation Age of Onset   Diabetes Sister    Hypertension Mother    Diabetes Father    Hyperlipidemia Father    Birth defects Maternal Grandfather     Social History   Tobacco Use   Smoking status: Never   Smokeless tobacco: Never  Substance Use Topics   Alcohol use: No   Drug use: No    Home Medications Prior to Admission medications   Medication Sig Start Date End Date Taking? Authorizing Provider  ferrous sulfate 325 (65 FE) MG tablet Take 325 mg by mouth 2 (two) times daily with a meal.   Yes [provider]  folic acid (FOLVITE) 800 MCG tablet Take 400 mcg by mouth daily.   Yes [provider]  ibuprofen (ADVIL,MOTRIN) 200 MG tablet Take 800 mg by mouth daily as needed for headache or moderate pain.    Yes [provider]  NALTREXONE HCL PO Take 1 tablet by mouth daily. Pt states this is 4.5mg    Yes [provider]  naphazoline-pheniramine (NAPHCON-A) 0.025-0.3 % ophthalmic solution Place 1 drop into the right eye 3 (three)  times daily. 06/29/20  Yes Zadie Rhine, MD  Omega 3 1200 MG CAPS Take 1,200 mg by mouth 2 (two) times daily.   Yes [provider]  propranolol (INDERAL) 80 MG tablet Take 80 mg by mouth daily.   Yes [provider]  azithromycin (ZITHROMAX) 250 MG tablet Take 1 tablet (250 mg total) by mouth daily. Take first 2 tablets together, then 1 every day until finished. Patient not taking: Reported on 12/26/2020 08/31/18   Shaune Pollack, MD    Allergies    Latex, Shellfish allergy, Wheat bran, Barley grass, Dairycare [lactase-lactobacillus], and Gramineae pollens  Review of Systems   Review of Systems  Cardiovascular:  Positive for chest pain.   Ten systems reviewed and are negative for acute change, except as noted in the HPI.   Physical Exam Updated Vital Signs BP 128/64 (BP Location: Left Arm)   Pulse (!) 107   Temp 98.5 F (36.9 C) (Oral)   Resp 16   Ht 5\' 4"  (1.626 m)   Wt 133.4 kg   LMP 12/21/2020 (Approximate)   SpO2 100%   BMI 50.46 kg/m   Physical Exam Vitals and nursing note reviewed.  Constitutional:      General: She is not in acute distress.    Appearance: She is well-developed. She is not diaphoretic.  HENT:     Head: Normocephalic and atraumatic.     Right Ear: External ear normal.     Left Ear: External ear normal.     Nose: Nose normal.     Mouth/Throat:     Mouth: Mucous membranes are moist.  Eyes:     General: No scleral icterus.    Conjunctiva/sclera: Conjunctivae normal.  Cardiovascular:     Rate and Rhythm: Normal rate and regular rhythm.     Heart sounds: Normal heart sounds. No murmur heard.   No friction rub. No gallop.  Pulmonary:     Effort: Pulmonary effort is normal. No respiratory distress.     Breath sounds: Normal breath sounds.  Chest:       Comments: Patient with tenderness, tightness, reproducible pain in the pectoralis and pectoralis minor.  She has fasciculation of tissue with direct pressure on trigger points.  Pain with range of motion of the left shoulder.  She has tenderness to palpation of the antagonist muscles over the shoulder blade and mid thoracic region. Abdominal:     General: Bowel sounds are normal. There is no distension.     Palpations: Abdomen is soft. There is no mass.     Tenderness: There is no abdominal tenderness. There is no guarding.  Musculoskeletal:     Cervical back: Normal range of motion.  Skin:    General: Skin is warm and dry.  Neurological:     Mental Status: She is alert and oriented to person, place, and time.  Psychiatric:        Behavior: Behavior normal.    ED Results / Procedures / Treatments   Labs (all labs ordered are  listed, but only abnormal results are displayed) Labs Reviewed  BASIC METABOLIC PANEL - Abnormal; Notable for the following components:      Result Value   Glucose, Bld 101 (*)    All other components within normal limits  CBC - Abnormal; Notable for the following components:   Hemoglobin 11.2 (*)    MCH 24.1 (*)    MCHC 29.9 (*)    RDW 16.8 (*)    All other components within  normal limits  I-STAT BETA HCG BLOOD, ED ([facility], [facility], [facility] ONLY)  TROPONIN I (HIGH SENSITIVITY)  TROPONIN I (HIGH SENSITIVITY)    EKG None  Radiology DG Chest 2 View  Result Date: 12/26/2020 CLINICAL DATA:  Chest pain. EXAM: CHEST - 2 VIEW COMPARISON:  Chest x-ray 08/31/2018 FINDINGS: The heart and mediastinal contours are within normal limits. No focal consolidation. No pulmonary edema. No pleural effusion. No pneumothorax. No acute osseous abnormality. IMPRESSION: No active cardiopulmonary disease. Electronically Signed   By: Tish Frederickson M.D.   On: 12/26/2020 17:59    Procedures Procedures   Medications Ordered in ED Medications - No data to display  ED Course  I have reviewed the triage vital signs and the nursing notes.  Pertinent labs & imaging results that were available during my care of the patient were reviewed by me and considered in my medical decision making (see chart for details).  Clinical Course as of 12/27/20 1041  Tue Dec 26, 2020  2019 EKG -NSR rate of 60 [AH]    Clinical Course User Index [AH] Arthor Captain, PA-C   MDM Rules/Calculators/A&P HEAR Score: 1                         40 year old female here with left-sided chest wall pain.  She has a heart score of 1.  Pain is reproducible with palpation and movement of the anterior left chest wall.The emergent differential diagnosis of chest pain includes: Acute coronary syndrome, pericarditis, aortic dissection, pulmonary embolism, tension pneumothorax, pneumonia, and esophageal rupture. Patient had one documented hr of 107, I  suspect aberrant. Labs that included CBC, BMP without significant abnormality, negative pregnancy test, negative troponin.  EKG shows normal sinus rhythm at a rate of 60.  Chest wall pain is reproducible and I have no suspicion for cardiac or pulmonary cause of her symptoms.  I also have low suspicion for radiculopathy.  The patient I think would benefit highly from physical therapy or therapeutic massage which I have discussed.  She does not currently have a primary care doctor in [place] I have given the patient a referral to our sports medicine colleague Dr. Jordan Likes in hopes that he may assess her chest wall pain and potentially get her into PT for evaluation and treatment of chest wall pain.  I think that her symptoms are compounded by her history of obesity although she has had significant weight loss she continues to have very large breasts, forward shoulder rounding and shortening in the pectoralis is bilaterally.  Patient appears otherwise appropriate for discharge at this time without emergent cause suspected for her complaints.  Discussed return precautions.    Final Clinical Impression(s) / ED Diagnoses Final diagnoses:  Acute chest wall pain    Rx / DC Orders ED Discharge Orders     None        Arthor Captain, PA-C 12/27/20 1044    [place], [place], MD 12/27/20 1514

## 2021-03-07 ENCOUNTER — Encounter: Payer: Self-pay | Admitting: Neurology

## 2021-03-16 ENCOUNTER — Other Ambulatory Visit: Payer: Self-pay

## 2021-03-16 ENCOUNTER — Ambulatory Visit: Payer: Medicare Other | Attending: Neurology

## 2021-03-16 DIAGNOSIS — R41841 Cognitive communication deficit: Secondary | ICD-10-CM

## 2021-03-17 NOTE — Therapy (Signed)
[facility] [address] , [place], [address] Phone: [phone]   Fax:  [phone]  Speech Language Pathology Evaluation  Patient Details  Name: Emily Ochoa MRN: [id number] Date of Birth: 1980-09-11 Referring Provider (SLP): Philips, Adline Peals, MD   Encounter Date: 03/16/2021   End of Session - 03/16/21 1607     Visit Number 1    Number of Visits 17    Date for SLP Re-Evaluation 05/11/21    Authorization Type [facility] Medicare/Medicaid [place] Access    SLP Start Time 1319   pt arrived late   SLP Stop Time  1400    SLP Time Calculation (min) 41 min    Activity Tolerance Patient tolerated treatment well             Past Medical History:  Diagnosis Date   Depression    Fibromyalgia    Hypertension    URI (upper respiratory infection)     Past Surgical History:  Procedure Laterality Date   BACK SURGERY     bioposy in 2009    There were no vitals filed for this visit.   Subjective Assessment - 03/16/21 1331     Subjective Pt was pleasant and engaged during evaluation    Currently in Pain? 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 No/denies      General Information   HPI SLP did not locate neurology records. Pt reported recent onset of cognitive challenges, in which pt reported she diagnosed with neurocognitive degenerative disorder. Brain lesions also reported. Behavior and pharmaceutical management has been effective to reduce some s/sx of cognitive deficits. Pt has  modified lifestyle (ex: increased sleep, altered diet) and has increased use of compensations (ex: writing down). Previous brain fog reported which was thought to be related to fibermyaglia.      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   [employer]     Cognition   Overall Cognitive Status Impaired/Different from baseline    Area of Impairment Attention;Memory;Awareness    Current Attention Level Alternating    Attention Comments difficulty with divided attention reported    Memory Decreased short-term memory    Memory Comments encouraged to use compensations for recall deficits    Awareness Comments Pt reports prior reduced awareness of missed bills      Auditory Comprehension   Overall Auditory Comprehension Impaired    Conversation Moderately complex    Other Conversation Comments intermittent repetition and  rephrasing required during CLQT    Interfering Components Working memory;Processing speed    [employer] Extra processing time;Repetition      Expression   Primary Mode of Expression Verbal      Verbal Expression   Overall Verbal Expression Impaired    Level of Generative/Spontaneous Verbalization Conversation    Other Verbal Expression Comments Pt endorses speech is "scrambled" and sometimes experiences word finding      Oral Motor/Sensory Function   Overall Oral Motor/Sensory Function Appears within functional limits for tasks assessed      Motor Speech   Overall Motor Speech Appears within functional limits for tasks assessed      Standardized Assessments   Standardized  Assessments  Cognitive Linguistic Quick Test      Cognitive Linguistic Quick Test (Ages 18-69)   Attention WNL    Memory WNL    Executive Function WNL    Language WNL    Visuospatial Skills WNL    Severity Rating Total 20    Composite Severity Rating 16.8                             SLP Education - 03/16/21 1607     Education Details eval results, possible goals    Person(s) Educated Patient    Methods Explanation;Demonstration    Comprehension Verbalized understanding;Need further instruction              SLP Short Term Goals - 03/16/21 1615       SLP SHORT TERM GOAL #1   Title Pt will implement memory and attention compensations to aid completion of daily tasks given rare min A over 2 sessions    Time 4    Period Weeks    Status New      SLP SHORT TERM GOAL #2   Title Pt complete divided/alternating attention tasks with 3 or less redirections over 2 sessions    Time 4    Period Weeks    Status New      SLP SHORT TERM GOAL #3   Title Pt will implement strateiges to aid processing and comprehension in conversation given rare min over 2 sessions    Time 4    Period Weeks    Status New      SLP SHORT TERM GOAL #4   Title Pt will complete cognitive communication PROM in first ST session    Time 2    Period Weeks    Status New      SLP SHORT TERM GOAL #5   Title Pt will undergo further linguistic assessment for reported word finding and comprehension changes    Time 2    Period Weeks    Status New              SLP Long Term Goals - 03/17/21 1641       SLP LONG TERM GOAL #1   Title Pt will utilize compensations and strategies to aid recall and attention without cues from family over 2 sessions    Time 8    Period Weeks    Status New      SLP LONG TERM GOAL #2   Title Pt will exhibit WNL language in 20+ minute conversation with rare min A over 2 sessions    Time 8    Period Weeks    Status New      SLP LONG TERM GOAL #3    Title Pt  will use strategies to aid processing in conversation and during written tasks with independent double check to increase accuracy given rare min A over 2 sessions    Time 8    Period Weeks    Status New      SLP LONG TERM GOAL #4   Title Pt will report improved cognitive communication functioning via PROM by 5 points at last ST session    Time 8    Period Weeks    Status New              Plan - 03/16/21 1608     Clinical Impression Statement "Rory" was referred for OPST given unspecified symptoms and signs involving cognitive fucntions and awareness. Pt was unaccompanied this session. Pt has been seeing a neurologist in [place]. Pt reported she was diagnosed with neurocognitive degenerative disorder within last 2-3 months. Pt indicated she currently gets "numbers mixed up" and has "scrambled speech." Pt is currently working, which pt indicates she has benefitted from routine for memory and cognitive functioning. Some recent cognitive improvements reported related to behavioral and pharmaceutical management. Pt is currently writing down information, keeping an agenda, and using phone as external memory aid. Pt has managed to gets finances corrected (pt was forgetting bills). Pt now requires co-workers to double check her work, with intermittent errors for numbers and letters. Pt is using synonyms to aid word finding. Reduced processing and comprehension also indicated, which was evidenced during adminstration of Cognitive Linguistic Quick Test (CLQT). Pt occasionally benefited from repetition and rephrasing. CLQT subtests were WNL but nearly rated as mild deficits for memory, executive function, and visuospatial skills. Pt exhibited some difficulty with problem solving, error awareness, and delayed processing. Pt reported neurologist endorsed good prognosis. Pt would benefit from skilled ST intervention to address cognitive linguistic skills to aid daily functioning and optimize  return to baseline.    Speech Therapy Frequency 2x / week    Duration 8 weeks    Treatment/Interventions Compensatory strategies;Functional tasks;Patient/family education;Cueing hierarchy;Cognitive reorganization;Multimodal communcation approach;Language facilitation;Compensatory techniques;Internal/external aids;SLP instruction and feedback    Potential to Achieve Goals Good    Potential Considerations Medical prognosis    Consulted and Agree with Plan of Care Patient             Patient will benefit from skilled therapeutic intervention in order to improve the following deficits and impairments:   Cognitive communication deficit    Problem List Patient Active Problem List   Diagnosis Date Noted   GERD (gastroesophageal reflux disease) 11/04/2014   Elevated blood sugar 11/03/2014   Morbid obesity (HCC) 11/03/2014   Depression 11/03/2014   Bulging lumbar disc 11/03/2014    Janann Colonel, MA CCC-SLP 03/17/2021, 4:46 PM  [facility] [address] [place] [facility] [place], [place], [address] Phone: [phone]   Fax:  [phone]  Name: Emily Ochoa MRN: [id number] Date of Birth: [birth year]

## 2021-03-19 ENCOUNTER — Ambulatory Visit: Payer: Medicare Other | Admitting: Speech Pathology

## 2021-03-29 ENCOUNTER — Ambulatory Visit: Payer: Medicare Other | Admitting: Speech Pathology

## 2021-04-05 ENCOUNTER — Emergency Department (HOSPITAL_BASED_OUTPATIENT_CLINIC_OR_DEPARTMENT_OTHER)
Admission: EM | Admit: 2021-04-05 | Discharge: 2021-04-05 | Disposition: A | Discharge status: Home / Self Care | Payer: Medicare Other | Attending: Emergency Medicine | Admitting: Emergency Medicine

## 2021-04-05 ENCOUNTER — Other Ambulatory Visit: Payer: Self-pay

## 2021-04-05 ENCOUNTER — Encounter: Payer: Medicare Other | Admitting: Speech Pathology

## 2021-04-05 ENCOUNTER — Encounter (HOSPITAL_BASED_OUTPATIENT_CLINIC_OR_DEPARTMENT_OTHER): Payer: Self-pay | Admitting: *Deleted

## 2021-04-05 DIAGNOSIS — K6289 Other specified diseases of anus and rectum: Secondary | ICD-10-CM | POA: Insufficient documentation

## 2021-04-05 DIAGNOSIS — Z91040 Latex allergy status: Secondary | ICD-10-CM | POA: Insufficient documentation

## 2021-04-05 DIAGNOSIS — Z79899 Other long term (current) drug therapy: Secondary | ICD-10-CM | POA: Diagnosis not present

## 2021-04-05 DIAGNOSIS — I10 Essential (primary) hypertension: Secondary | ICD-10-CM | POA: Insufficient documentation

## 2021-04-05 MED ORDER — POLYETHYLENE GLYCOL 3350 17 G PO PACK: 17.0000 g | PACK | Freq: Every day | ORAL | 0 refills | Status: DC

## 2021-04-05 NOTE — ED Notes (Signed)
RN provided AVS using Teachback Method. Patient verbalizes understanding of Discharge Instructions. Opportunity for Questioning and Answers were provided by RN. Patient Discharged from ED ambulatory to Home via Self.  

## 2021-04-05 NOTE — Discharge Instructions (Addendum)
You were seen in the ED for rectal bleeding and rectal pain.  We did a rectal exam and did not see or feel any hemorrhoids.  We think this could either be because of a small tear in the canal from being constipated or because of your history of diverticulitis.  In the meantime, please eat foods high in fiber including fruits and cooked vegetables.  I am prescribing you a medication called MiraLAX, please take this as prescribed for constipation.  Otherwise, we would like you to follow-up with your gastroenterologist to get your colonoscopy in case this could be due to your diverticulosis.  You are stable for discharge and do not need to be admitted to the hospital.  If any symptoms change or worsen acutely, please return to the nearest emergency department.

## 2021-04-05 NOTE — ED Triage Notes (Signed)
Pt had hard BM yesterday followed by bright red bleeding, Had another BM that was soft, no bleeding noted but saw a little when passing gas.

## 2021-04-05 NOTE — ED Provider Notes (Signed)
[facility] EMERGENCY DEPT Provider Note   CSN: [id number] Arrival date & time: 04/05/21  1642     History Chief Complaint  Patient presents with   Rectal Bleeding    Emily Ochoa is a 40 y.o. female with a history of diverticulitis who presents to the ED today with complaints of BRBPR.  She endorses is feeling more constipated over the past week.  She had a couple of hard bowel movements with rectal pain with defecation, but last week she did not notice any blood in her stool.  Yesterday, the patient had a sharp pain with defecation followed by some bright red blood on her toilet paper.  She did not notice any more bleeding until today when she defecated and noticed some blood with clots.  This is abnormal for her as she usually has 3-4 soft stools a day.  She also endorses a feeling of incomplete defecation and the inability to pass flatus at times.  She stays well-hydrated throughout the day.  No history of hemorrhoids.  Her last colonoscopy was 5-7 years ago, and this was unremarkable.  She is followed by gastroenterologist with plans to obtain a repeat colonoscopy soon.  No other complaints or concerns today.   Rectal Bleeding Associated symptoms: no abdominal pain       Past Medical History:  Diagnosis Date   Depression    Fibromyalgia    Hypertension    URI (upper respiratory infection)     Patient Active Problem List   Diagnosis Date Noted   GERD (gastroesophageal reflux disease) 11/04/2014   Elevated blood sugar 11/03/2014   Morbid obesity (HCC) 11/03/2014   Depression 11/03/2014   Bulging lumbar disc 11/03/2014    Past Surgical History:  Procedure Laterality Date   BACK SURGERY     bioposy in 2009     OB History   No obstetric history on file.     Family History  Problem Relation Age of Onset   Diabetes Sister    Hypertension Mother    Diabetes Father    Hyperlipidemia Father    Birth defects Maternal Grandfather     Social  History   Tobacco Use   Smoking status: Never   Smokeless tobacco: Never  Substance Use Topics   Alcohol use: No   Drug use: No    Home Medications Prior to Admission medications   Medication Sig Start Date End Date Taking? Authorizing Provider  polyethylene glycol (MIRALAX / GLYCOLAX) 17 g packet Take 17 g by mouth daily. 04/05/21  Yes Andrey Campanile, MD  azithromycin (ZITHROMAX) 250 MG tablet Take 1 tablet (250 mg total) by mouth daily. Take first 2 tablets together, then 1 every day until finished. Patient not taking: Reported on 12/26/2020 08/31/18   Shaune Pollack, MD  ferrous sulfate 325 (65 FE) MG tablet Take 325 mg by mouth 2 (two) times daily with a meal.    [provider]  folic acid (FOLVITE) 800 MCG tablet Take 400 mcg by mouth daily.    [provider]  ibuprofen (ADVIL,MOTRIN) 200 MG tablet Take 800 mg by mouth daily as needed for headache or moderate pain.     [provider]  NALTREXONE HCL PO Take 1 tablet by mouth daily. Pt states this is 4.5mg     [provider]  naphazoline-pheniramine (NAPHCON-A) 0.025-0.3 % ophthalmic solution Place 1 drop into the right eye 3 (three) times daily. 06/29/20   Zadie Rhine, MD  naproxen (NAPROSYN) 375 MG tablet  Take 1 tablet (375 mg total) by mouth 2 (two) times daily with a meal. 12/26/20   Harris, Abigail, PA-C  Omega 3 1200 MG CAPS Take 1,200 mg by mouth 2 (two) times daily.    [provider]  propranolol (INDERAL) 80 MG tablet Take 80 mg by mouth daily.    [provider]    Allergies    Latex, Shellfish allergy, Wheat bran, Almond (diagnostic), Barley grass, Dairycare [lactase-lactobacillus], and Gramineae pollens  Review of Systems   Review of Systems  Constitutional: Negative.   HENT: Negative.    Eyes: Negative.   Respiratory: Negative.    Cardiovascular: Negative.   Gastrointestinal:  Positive for blood in stool, constipation and hematochezia. Negative for  abdominal pain.  Endocrine: Negative.   Genitourinary: Negative.   Musculoskeletal: Negative.   Skin: Negative.   Allergic/Immunologic: Negative.   Neurological: Negative.   Hematological: Negative.   Psychiatric/Behavioral: Negative.     Physical Exam Updated Vital Signs BP (!) 147/102 (BP Location: Right Arm)   Pulse 69   Temp 98.5 F (36.9 C)   Resp 16   Ht 5\' 4"  (1.626 m)   Wt (!) 145.2 kg   SpO2 100%   BMI 54.93 kg/m   Physical Exam Exam conducted with a chaperone present.  Constitutional:      General: She is not in acute distress.    Appearance: Normal appearance.  HENT:     Head: Normocephalic and atraumatic.  Eyes:     Extraocular Movements: Extraocular movements intact.     Pupils: Pupils are equal, round, and reactive to light.  Cardiovascular:     Rate and Rhythm: Normal rate and regular rhythm.     Heart sounds: No murmur heard.   No friction rub. No gallop.  Pulmonary:     Effort: Pulmonary effort is normal.     Breath sounds: Normal breath sounds. No wheezing, rhonchi or rales.  Abdominal:     General: Abdomen is flat. There is no distension.     Tenderness: There is no abdominal tenderness.  Genitourinary:    Comments: Rectal exam performed in the presence of a chaperone.  No external or internal hemorrhoids appreciated.  No blood in the anal canal. No fissures appreciated.   Musculoskeletal:        General: Normal range of motion.  Skin:    General: Skin is warm and dry.  Neurological:     General: No focal deficit present.     Mental Status: She is alert and oriented to person, place, and time. Mental status is at baseline.  Psychiatric:        Mood and Affect: Mood normal.        Behavior: Behavior normal.    ED Results / Procedures / Treatments   Labs (all labs ordered are listed, but only abnormal results are displayed) Labs Reviewed - No data to display  EKG None  Radiology No results found.  Procedures Procedures    Medications Ordered in ED Medications - No data to display  ED Course  I have reviewed the triage vital signs and the nursing notes.  Pertinent labs & imaging results that were available during my care of the patient were reviewed by me and considered in my medical decision making (see chart for details).    MDM Rules/Calculators/A&P                          This is  a 40 year old with history of diverticulosis who presented to the ED with rectal pain and BRBPR for two days. Patient is afebrile and HDS. Exam, including rectal exam, is overall unremarkable. Differential includes anal fissure versus diverticular bleed.  Patient was discharged in stable condition with the recommendation to add more fiber to her diet, stay well-hydrated, and has been prescribed MiraLAX to take for her constipation.  She will follow-up with her gastroenterologist for colonoscopy in the near future.  Patient was advised to return to the nearest ED if her symptoms acutely worsen or change.   Final Clinical Impression(s) / ED Diagnoses Final diagnoses:  Rectal pain    Rx / DC Orders ED Discharge Orders          Ordered    polyethylene glycol (MIRALAX / GLYCOLAX) 17 g packet  Daily        04/05/21 2250             Andrey Campanile, MD 04/05/21 2257    Tegeler, Canary Brim, MD 04/07/21 0006

## 2021-04-25 ENCOUNTER — Encounter: Payer: Medicare Other | Admitting: Speech Pathology

## 2021-05-17 ENCOUNTER — Other Ambulatory Visit: Payer: Self-pay

## 2021-05-17 ENCOUNTER — Emergency Department (HOSPITAL_COMMUNITY)
Admission: EM | Admit: 2021-05-17 | Discharge: 2021-05-17 | Discharge status: Against Medical Advice | Payer: Medicare Other | Source: Home / Self Care

## 2021-05-17 ENCOUNTER — Emergency Department (HOSPITAL_BASED_OUTPATIENT_CLINIC_OR_DEPARTMENT_OTHER)
Admission: EM | Admit: 2021-05-17 | Discharge: 2021-05-17 | Disposition: A | Discharge status: Home / Self Care | Payer: Medicare Other | Attending: Emergency Medicine | Admitting: Emergency Medicine

## 2021-05-17 ENCOUNTER — Encounter (HOSPITAL_BASED_OUTPATIENT_CLINIC_OR_DEPARTMENT_OTHER): Payer: Self-pay

## 2021-05-17 DIAGNOSIS — K0889 Other specified disorders of teeth and supporting structures: Secondary | ICD-10-CM | POA: Diagnosis not present

## 2021-05-17 DIAGNOSIS — Z91040 Latex allergy status: Secondary | ICD-10-CM | POA: Insufficient documentation

## 2021-05-17 MED ORDER — AMOXICILLIN-POT CLAVULANATE 875-125 MG PO TABS: 1.0000 | ORAL_TABLET | Freq: Two times a day (BID) | ORAL | 0 refills | Status: DC

## 2021-05-17 MED ORDER — OXYCODONE-ACETAMINOPHEN 5-325 MG PO TABS: 1.0000 | ORAL_TABLET | Freq: Four times a day (QID) | ORAL | 0 refills | Status: DC | PRN

## 2021-05-17 NOTE — ED Triage Notes (Signed)
Pt c/o tooth pain on front teeth on left. Pt reports she has an appointment for root canal but is feeling nauseous now and having hot & cold chills since last week.

## 2021-05-17 NOTE — ED Provider Notes (Signed)
[facility] EMERGENCY DEPT Provider Note   CSN: [id number] Arrival date & time: 05/17/21  2014     History Chief Complaint  Patient presents with   Dental Pain    Emily Ochoa is a 41 y.o. female who presents the emergency department with dental pain that has been ongoing for several months.  Patient has been seen numerous times by her dentist for the symptoms in the past.  She initially started off having a filling but the filling failed and she needed to progress to a root canal.  She was unable to get through canal initially as the dentist did not perform it in the office and she has been looking for one since.  She did find 1 but was unable to come up with the co-pay at the time.  Patient has now since scheduled the root canal for the 30th of this month.  Coming in for increased level of pain and another round of antibiotics.  She denies any trouble swallowing, fever, chills.   Dental Pain     Home Medications Prior to Admission medications   Medication Sig Start Date End Date Taking? Authorizing Provider  amoxicillin-clavulanate (AUGMENTIN) 875-125 MG tablet Take 1 tablet by mouth every 12 (twelve) hours. 05/17/21  Yes Meredeth Ide, Lutricia Widjaja M, PA-C  oxyCODONE-acetaminophen (PERCOCET/ROXICET) 5-325 MG tablet Take 1 tablet by mouth every 6 (six) hours as needed for severe pain. 05/17/21  Yes Meredeth Ide, Azai Gaffin M, PA-C  azithromycin (ZITHROMAX) 250 MG tablet Take 1 tablet (250 mg total) by mouth daily. Take first 2 tablets together, then 1 every day until finished. Patient not taking: Reported on 12/26/2020 08/31/18   Shaune Pollack, MD  ferrous sulfate 325 (65 FE) MG tablet Take 325 mg by mouth 2 (two) times daily with a meal.    [provider]  folic acid (FOLVITE) 800 MCG tablet Take 400 mcg by mouth daily.    [provider]  ibuprofen (ADVIL,MOTRIN) 200 MG tablet Take 800 mg by mouth daily as needed for headache or moderate pain.     [provider]  naphazoline-pheniramine (NAPHCON-A) 0.025-0.3 % ophthalmic solution Place 1 drop into the right eye 3 (three) times daily. 06/29/20   Zadie Rhine, MD  naproxen (NAPROSYN) 375 MG tablet Take 1 tablet (375 mg total) by mouth 2 (two) times daily with a meal. 12/26/20   Harris, Abigail, PA-C  Omega 3 1200 MG CAPS Take 1,200 mg by mouth 2 (two) times daily.    [provider]  polyethylene glycol (MIRALAX / GLYCOLAX) 17 g packet Take 17 g by mouth daily. 04/05/21   Andrey Campanile, MD  propranolol (INDERAL) 80 MG tablet Take 80 mg by mouth daily.    [provider]      Allergies    Latex, Shellfish allergy, Wheat bran, Almond (diagnostic), Barley grass, Dairycare [lactase-lactobacillus], and Gramineae pollens    Review of Systems   Review of Systems  All other systems reviewed and are negative.  Physical Exam Updated Vital Signs BP (!) 145/95 (BP Location: Right Arm)    Pulse 66    Temp 98.6 F (37 C) (Oral)    Resp 17    Ht 5\' 4"  (1.626 m)    Wt (!) 147.4 kg    SpO2 100%    BMI 55.79 kg/m  Physical Exam Vitals and nursing note reviewed.  Constitutional:      Appearance: Normal appearance.  HENT:     Head: Normocephalic and atraumatic.  Mouth/Throat:     Comments: There is a small pin size pustule above the eighth and ninth teeth.  No obvious dental caries. Eyes:     General:        Right eye: No discharge.        Left eye: No discharge.     Conjunctiva/sclera: Conjunctivae normal.  Pulmonary:     Effort: Pulmonary effort is normal.  Skin:    General: Skin is warm and dry.     Findings: No rash.  Neurological:     General: No focal deficit present.     Mental Status: She is alert.  Psychiatric:        Mood and Affect: Mood normal.        Behavior: Behavior normal.    ED Results / Procedures / Treatments   Labs (all labs ordered are listed, but only abnormal results are displayed) Labs Reviewed - No data to  display  EKG None  Radiology No results found.  Procedures Procedures   Medications Ordered in ED Medications - No data to display  ED Course/ Medical Decision Making/ A&P                           Medical Decision Making  Emily Ochoa is a 41 y.o. female who presents the emergency department for dental pain.  She has no significant comorbidities to impact her care today.  She has no significant health or terms of health to impact her care today.  Overall I believe this is not an emergent issue at this time.  She does have a root canal scheduled for the 30th.  I told her to keep that appointment.  I will write her a 7-day course of Augmentin as there does appear to be a small pustule over the upper gumline.  We will also give her short course of pain medication for breakthrough pain.  Patient was amenable to this plan.  All questions or concerns addressed.  Strict turn precautions given.  She is safe for discharge in the outpatient setting.  Final Clinical Impression(s) / ED Diagnoses Final diagnoses:  Pain, dental    Rx / DC Orders ED Discharge Orders          Ordered    amoxicillin-clavulanate (AUGMENTIN) 875-125 MG tablet  Every 12 hours        05/17/21 2136    oxyCODONE-acetaminophen (PERCOCET/ROXICET) 5-325 MG tablet  Every 6 hours PRN        05/17/21 2136              Teressa Lower, PA-C 05/17/21 2141    Terald Sleeper, MD 05/18/21 1123

## 2021-05-17 NOTE — Discharge Instructions (Addendum)
Please take 600 mg ibuprofen every 6 hours as needed for pain.  Please take Augmentin for the next 7 days which is the antibiotic.  Percocet for breakthrough pain.  Please keep your appointment for the 30th for your root canal.  Please return to the emergency department for worsening symptoms.

## 2021-06-04 NOTE — Progress Notes (Signed)
NEUROLOGY CONSULTATION NOTE  Emily Ochoa MRN: [id number] DOB: 1980-07-12  Referring provider: Leonel Ramsay, MD Primary care provider:   Reason for consult:  migraines, neurocognitive disorder  Assessment/Plan:   1  Both daily headaches and episodes of right sided facial numbness and eye pain may be migraine without aura and migraine with aura respectively. 2  Mild neurocognitive disorder - etiology unclear whether it is due to external factors or primary neurologic etiology. 3  Abnormal white matter on brain MRI - nonspecific findings but given her symptoms, follow up/monitoring needed.  For management of headaches, start nortriptyline 25mg  at bedtime.  We can increase to 50mg  at bedtime in 6 weeks if needed.  Monitor blood pressure. Repeat MRI of brain with as well as without contrast She takes B12 supplement but will check level to ensure adequate absorption Repeat neuropsychological evaluation in August Limit use of pain relievers to no more than 2 days out of week to prevent risk of rebound or medication-overuse headache. Keep headache diary Follow up in 4 months.   Subjective:  Emily Ochoa is a 41 year old female with HTN, fibromyalgia and migraines who presents for migraines and cognitive deficits  In 2022, she began experiencing new headaches.  No prior history of headaches.  She describes 5-6/10 pounding right parietal headache with mild nausea and osmophobia but no vomiting, photophobia, phonophobia, visual disturbance, dizziness, unilateral numbness or weakness.  They last about 1-2 hours but occur three times daily.  No known triggers.  Laying down to rest helps.  Takes ibuprofen but no more than 2 days out of the week.    She also began having episodes of numbness and tingling.  Initially she experienced numbness and tingling of the entire head and body.  She was previously taking topiramate for fibromyalgia.  She stopped taking it but symptoms persisted.   She no longer has the full body numbness but now has episodes of right facial numbness and tingling associated with 5-6/10 throbbing right retroorbital pain and eye twitch.  Lasts off and on all day.  She has had about 5 or 6 episodes over the past year.    She started having balance issues, particularly when first standing up.  She would feel unsteady.  No dizziness.  This has overall improved.  She reports onset of memory problems in 2016, around the time she was diagnosed with fibromyalgia.  Specifically, she had brain fog and forgetfullness.  In early 2022, she began experiencing a steady cognitive decline.  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  She also reports difficulty remembering to take medication.  She started having difficulty completing chores, She sometimes mixes up words.  She has had episodes of slurred speech.    Labs performed in 2022 include sed rate 27 CRP 2, normal SPEP and TSH.  She takes oral B12.  MRI of brain and cervical spine without contrast on 09/23/2020 personally reviewed showed nonspecific multiple small T2 hyperintensities within the cerebral white matter.  She underwent neuropsychological evaluation in July 2022, which demonstrated deficiencies in launguage, some aspects of executive functioning and visuospatial perception, meeting criteria for mild neurocognitive disorder.  Etiology unclear.  The report mentions that executive dysfunction and very mild retrieval-based memory difficulties may be related to nonspecific T2 hyperintensities in the frontal-subcortical region on MRI but also concern for early stages of PPA given language difficulties.Mild anxiety, sleep difficulties and some degree of somatization may be contributing factors.  X-ray l mild L5-S1   Magnesium thyrate, alpha-brain, lion's mane, se  Current NSAIDS/analgesics:  ibuprofen (rarely) Current triptans:  none Current ergotamine:   none Current anti-emetic:  none Current muscle relaxants:  tizanidine 4mg  BID PRN Current Antihypertensive medications:  propranolol ER 80mg  daily (higher doses effective but caused side effects - nausea) Current Antidepressant medications:  none Current Anticonvulsant medications:  topiramate XR 50mg  daily Current anti-CGRP:  none Current Vitamins/Herbal/Supplements:  magnesium chloride, alpha-Brain, D3, folic acid, fish oil Current Antihistamines/Decongestants:  loratidine Other therapy:  none Hormone/birth control:  none Other medications:  none  Past NSAIDS/analgesics:  none Past abortive triptans:  none Past abortive ergotamine:  none Past muscle relaxants:  none Past anti-emetic:  none Past antihypertensive medications:  none Past antidepressant medications:  Cymbalta Past anticonvulsant medications:  topiramate Past anti-CGRP:  none Past vitamins/Herbal/Supplements:  none Past antihistamines/decongestants:  none Other past therapies:  none    PAST MEDICAL HISTORY: Past Medical History:  Diagnosis Date   Depression    Fibromyalgia    Hypertension    URI (upper respiratory infection)     PAST SURGICAL HISTORY: Past Surgical History:  Procedure Laterality Date   BACK SURGERY     bioposy in 2009    MEDICATIONS: Current Outpatient Medications on File Prior to Visit  Medication Sig Dispense Refill   amoxicillin-clavulanate (AUGMENTIN) 875-125 MG tablet Take 1 tablet by mouth every 12 (twelve) hours. 14 tablet 0   azithromycin (ZITHROMAX) 250 MG tablet Take 1 tablet (250 mg total) by mouth daily. Take first 2 tablets together, then 1 every day until finished. (Patient not taking: Reported on 12/26/2020) 6 tablet 0   ferrous sulfate 325 (65 FE) MG tablet Take 325 mg by mouth 2 (two) times daily with a meal.     folic acid (FOLVITE) Q000111Q MCG tablet Take 400 mcg by mouth daily.     ibuprofen (ADVIL,MOTRIN) 200 MG tablet Take 800 mg by mouth daily as needed for headache  or moderate pain.      naphazoline-pheniramine (NAPHCON-A) 0.025-0.3 % ophthalmic solution Place 1 drop into the right eye 3 (three) times daily. 15 mL 0   naproxen (NAPROSYN) 375 MG tablet Take 1 tablet (375 mg total) by mouth 2 (two) times daily with a meal. 20 tablet 0   Omega 3 1200 MG CAPS Take 1,200 mg by mouth 2 (two) times daily.     oxyCODONE-acetaminophen (PERCOCET/ROXICET) 5-325 MG tablet Take 1 tablet by mouth every 6 (six) hours as needed for severe pain. 10 tablet 0   polyethylene glycol (MIRALAX / GLYCOLAX) 17 g packet Take 17 g by mouth daily. 14 each 0   propranolol (INDERAL) 80 MG tablet Take 80 mg by mouth daily.     No current facility-administered medications on file prior to visit.    ALLERGIES: Allergies  Allergen Reactions   Latex Hives, Itching and Rash   Shellfish Allergy Anaphylaxis and Swelling   Wheat Bran Rash    Other reaction(s): Abdominal Pain     Almond (Diagnostic) Swelling   Barley Grass Other (See Comments)    Upset stomach   Dairycare [Lactase-Lactobacillus] Diarrhea   Gramineae Pollens Itching    FAMILY HISTORY: Family History  Problem Relation Age of Onset   Diabetes Sister    Hypertension Mother    Diabetes Father    Hyperlipidemia Father    Birth defects Maternal Grandfather     Objective:  Blood pressure 139/82, pulse 72, height 5\' 4"  (1.626 m), weight Marland Kitchen)  332 lb (150.6 kg), SpO2 100 %. General: No acute distress.  Patient appears well-groomed.   Head:  Normocephalic/atraumatic Eyes:  fundi examined but not visualized Neck: supple, no paraspinal tenderness, full range of motion Back: No paraspinal tenderness Heart: regular rate and rhythm Lungs: Clear to auscultation bilaterally. Vascular: No carotid bruits. Neurological Exam: Mental status: alert and oriented to person, place, and time, recent and remote memory intact, fund of knowledge intact, attention and concentration intact, speech fluent and not dysarthric, language  intact. Cranial nerves: CN I: not tested CN II: pupils equal, round and reactive to light, visual fields intact CN III, IV, VI:  full range of motion, no nystagmus, no ptosis CN V: facial sensation intact. CN VII: upper and lower face symmetric CN VIII: hearing intact CN IX, X: gag intact, uvula midline CN XI: sternocleidomastoid and trapezius muscles intact CN XII: tongue midline Bulk & Tone: normal, no fasciculations. Motor:  muscle strength 5/5 throughout Sensation:  Pinprick, temperature and vibratory sensation intact. Deep Tendon Reflexes:  2+ throughout,  toes downgoing.   Finger to nose testing:  Without dysmetria.   Heel to shin:  Without dysmetria.   Gait:  Normal station and stride.  able to turn and walk in tandem.  Romberg negative.    Thank you for allowing me to take part in the care of this patient.  Metta Clines, DO  CC:  Darliss Ridgel, MD

## 2021-06-05 ENCOUNTER — Other Ambulatory Visit (INDEPENDENT_AMBULATORY_CARE_PROVIDER_SITE_OTHER): Payer: Medicare Other

## 2021-06-05 ENCOUNTER — Other Ambulatory Visit: Payer: Self-pay

## 2021-06-05 ENCOUNTER — Encounter: Payer: Self-pay | Admitting: Neurology

## 2021-06-05 ENCOUNTER — Ambulatory Visit (INDEPENDENT_AMBULATORY_CARE_PROVIDER_SITE_OTHER): Payer: Medicare Other | Admitting: Neurology

## 2021-06-05 VITALS — BP 139/82 | HR 72 | Ht 64.0 in | Wt 332.0 lb

## 2021-06-05 DIAGNOSIS — R2689 Other abnormalities of gait and mobility: Secondary | ICD-10-CM | POA: Diagnosis not present

## 2021-06-05 DIAGNOSIS — G3184 Mild cognitive impairment, so stated: Secondary | ICD-10-CM

## 2021-06-05 DIAGNOSIS — G43109 Migraine with aura, not intractable, without status migrainosus: Secondary | ICD-10-CM | POA: Diagnosis not present

## 2021-06-05 DIAGNOSIS — R9089 Other abnormal findings on diagnostic imaging of central nervous system: Secondary | ICD-10-CM

## 2021-06-05 DIAGNOSIS — G43009 Migraine without aura, not intractable, without status migrainosus: Secondary | ICD-10-CM | POA: Diagnosis not present

## 2021-06-05 LAB — VITAMIN B12: Vitamin B-12: 619 pg/mL (ref 211–911)

## 2021-06-05 MED ORDER — NORTRIPTYLINE HCL 25 MG PO CAPS: 25.0000 mg | ORAL_CAPSULE | Freq: Every day | ORAL | 3 refills | Status: DC

## 2021-06-05 NOTE — Patient Instructions (Signed)
Start nortriptyline 25mg  at bedtime.  If no improvement in headaches in 6 weeks, contact me Limit use of pain relievers to no more than 2 days out of week to prevent risk of rebound or medication-overuse headache. Check B12  Check MRI of brain with and without contrast.  Bring copy of old MRI from last year to the MRI facility and give it to someone for the radiologist to read and compare Neurocognitive evaluation Follow up 4 months.

## 2021-06-10 ENCOUNTER — Encounter (HOSPITAL_BASED_OUTPATIENT_CLINIC_OR_DEPARTMENT_OTHER): Payer: Self-pay

## 2021-06-10 ENCOUNTER — Other Ambulatory Visit: Payer: Self-pay

## 2021-06-10 ENCOUNTER — Emergency Department (HOSPITAL_BASED_OUTPATIENT_CLINIC_OR_DEPARTMENT_OTHER)
Admission: EM | Admit: 2021-06-10 | Discharge: 2021-06-10 | Disposition: A | Discharge status: Home / Self Care | Payer: Medicare Other | Attending: Emergency Medicine | Admitting: Emergency Medicine

## 2021-06-10 DIAGNOSIS — Z91040 Latex allergy status: Secondary | ICD-10-CM | POA: Insufficient documentation

## 2021-06-10 DIAGNOSIS — J45909 Unspecified asthma, uncomplicated: Secondary | ICD-10-CM | POA: Insufficient documentation

## 2021-06-10 DIAGNOSIS — J069 Acute upper respiratory infection, unspecified: Secondary | ICD-10-CM | POA: Insufficient documentation

## 2021-06-10 DIAGNOSIS — Z20822 Contact with and (suspected) exposure to covid-19: Secondary | ICD-10-CM | POA: Insufficient documentation

## 2021-06-10 DIAGNOSIS — R0981 Nasal congestion: Secondary | ICD-10-CM | POA: Diagnosis present

## 2021-06-10 LAB — RESP PANEL BY RT-PCR (FLU A&B, COVID) ARPGX2
Influenza A by PCR: NEGATIVE
Influenza B by PCR: NEGATIVE
SARS Coronavirus 2 by RT PCR: NEGATIVE

## 2021-06-10 LAB — GROUP A STREP BY PCR: Group A Strep by PCR: NOT DETECTED

## 2021-06-10 MED ORDER — ONDANSETRON HCL 4 MG PO TABS: 4.0000 mg | ORAL_TABLET | Freq: Four times a day (QID) | ORAL | 0 refills | Status: DC

## 2021-06-10 NOTE — ED Notes (Signed)
EMT-P provided AVS using Teachback Method. Patient verbalizes understanding of Discharge Instructions. Opportunity for Questioning and Answers were provided by EMT-P. Patient Discharged from ED.  ? ?

## 2021-06-10 NOTE — ED Provider Notes (Signed)
[facility] DEPT Provider Note   CSN: [id number] Arrival date & time: 06/10/21  1157     History  Chief Complaint  Patient presents with   Cough    Emily Ochoa is a 41 y.o. female with past medical history significant for acid reflux, morbid obesity, asthma who presents with congestion, headache, sore throat, body ache, cough, feeling of chills since Friday.  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  Patient reports that she has had a cough productive of clear whitish sputum.  Patient reports occasional pressure in her ears.  She denies any objective fever at home.  She reports some occasional nausea but no vomiting.  She sometimes uses an albuterol inhaler but has not had to use it over the last few days.  Patient denies any diarrhea, constipation, reports that she has had a normal appetite.   Cough Associated symptoms: sore throat       Home Medications Prior to Admission medications   Medication Sig Start Date End Date Taking? Authorizing Provider  ondansetron (ZOFRAN) 4 MG tablet Take 1 tablet (4 mg total) by mouth every 6 (six) hours. 06/10/21  Yes Shriyan Arakawa H, PA-C  amoxicillin-clavulanate (AUGMENTIN) 875-125 MG tablet Take 1 tablet by mouth every 12 (twelve) hours. Patient not taking: Reported on 06/05/2021 05/17/21   Hendricks Limes, PA-C  azithromycin (ZITHROMAX) 250 MG tablet Take 1 tablet (250 mg total) by mouth daily. Take first 2 tablets together, then 1 every day until finished. Patient not taking: Reported on 12/26/2020 08/31/18   Duffy Bruce, MD  ferrous sulfate 325 (65 FE) MG tablet Take 325 mg by mouth 2 (two) times daily with a meal.    [provider]  folic acid (FOLVITE) Q000111Q MCG tablet Take 400 mcg by mouth daily.    [provider]  ibuprofen (ADVIL,MOTRIN) 200 MG tablet Take 800 mg by mouth daily as needed for headache or moderate  pain.  Patient not taking: Reported on 06/05/2021    [provider]  naphazoline-pheniramine (NAPHCON-A) 0.025-0.3 % ophthalmic solution Place 1 drop into the right eye 3 (three) times daily. Patient not taking: Reported on 06/05/2021 06/29/20   Ripley Fraise, MD  naproxen (NAPROSYN) 375 MG tablet Take 1 tablet (375 mg total) by mouth 2 (two) times daily with a meal. Patient not taking: Reported on 06/05/2021 12/26/20   Margarita Mail, PA-C  nortriptyline (PAMELOR) 25 MG capsule Take 1 capsule (25 mg total) by mouth at bedtime. 06/05/21   Jaffe, Adam R, DO  Omega 3 1200 MG CAPS Take 1,200 mg by mouth 2 (two) times daily.    [provider]  oxyCODONE-acetaminophen (PERCOCET/ROXICET) 5-325 MG tablet Take 1 tablet by mouth every 6 (six) hours as needed for severe pain. Patient not taking: Reported on 06/05/2021 05/17/21   Hendricks Limes, PA-C  polyethylene glycol (MIRALAX / GLYCOLAX) 17 g packet Take 17 g by mouth daily. Patient not taking: Reported on 06/05/2021 04/05/21   Orvis Brill, MD  propranolol (INDERAL) 80 MG tablet Take 80 mg by mouth daily.    [provider]      Allergies    Latex, Shellfish allergy, Wheat bran, Almond (diagnostic), Barley grass, Dairycare [lactase-lactobacillus], and Gramineae pollens    Review of Systems   Review of Systems  HENT:  Positive for sore throat.   Respiratory:  Positive for cough.   All other  systems reviewed and are negative.  Physical Exam Updated Vital Signs BP (!) 134/98    Pulse 74    Temp 97.8 F (36.6 C)    Resp 16    Ht 5\' 4"  (1.626 m)    Wt (!) 150.6 kg    SpO2 100%    BMI 56.99 kg/m  Physical Exam Vitals and nursing note reviewed.  Constitutional:      General: She is not in acute distress.    Appearance: Normal appearance.  HENT:     Head: Normocephalic and atraumatic.     Mouth/Throat:     Comments: Clear oropharynx, tonsils are 1 plus bilaterally, uvula midline. Eyes:     General:        Right  eye: No discharge.        Left eye: No discharge.  Cardiovascular:     Rate and Rhythm: Normal rate and regular rhythm.     Heart sounds: No murmur heard.   No friction rub. No gallop.  Pulmonary:     Effort: Pulmonary effort is normal.     Breath sounds: Normal breath sounds.     Comments: Clear breath sounds throughout both lungs, no wheezing, no rhonchi, no stridor, no respiratory distress Abdominal:     General: Bowel sounds are normal.     Palpations: Abdomen is soft.  Skin:    General: Skin is warm and dry.     Capillary Refill: Capillary refill takes less than 2 seconds.  Neurological:     Mental Status: She is alert and oriented to person, place, and time.  Psychiatric:        Mood and Affect: Mood normal.        Behavior: Behavior normal.    ED Results / Procedures / Treatments   Labs (all labs ordered are listed, but only abnormal results are displayed) Labs Reviewed  RESP PANEL BY RT-PCR (FLU A&B, COVID) ARPGX2  GROUP A STREP BY PCR    EKG None  Radiology No results found.  Procedures Procedures    Medications Ordered in ED Medications - No data to display  ED Course/ Medical Decision Making/ A&P                           Medical Decision Making  This is an overall well-appearing patient who presents with concern for some congestion, sore throat, sinus pressure, body aches, cough.  She does have a history of asthma, morbid obesity.  My emergent differential diagnosis includes asthma exacerbation, acute bronchitis, pneumonia versus other.  This is not an exhaustive differential.  My physical exam is remarkable for an overall well-appearing patient with no respiratory distress, no accessory breath sounds.  She is afebrile, her vital signs are stable.  RVP negative for flu, COVID, strep negative.  I personally interpreted these results.  Discussed with patient that she likely has an upper respiratory infection of unknown viral origin, recommend  ibuprofen, Tylenol, plenty of fluids, rest.  She has had some nausea we will provide her with a prescription for Zofran.  Patient discharged in stable condition at this time, return precautions given. Final Clinical Impression(s) / ED Diagnoses Final diagnoses:  Viral upper respiratory tract infection    Rx / DC Orders ED Discharge Orders          Ordered    ondansetron (ZOFRAN) 4 MG tablet  Every 6 hours        06/10/21 1304  Dorien Chihuahua 06/10/21 1304    Sherwood Gambler, MD 06/13/21 [phone]

## 2021-06-10 NOTE — ED Triage Notes (Signed)
Patient here POV from Home with Flu-Like Symptoms.   Patient endorses Congestion, Headache, Sore Throat, Body Aches, Productive Cough and Chills  No Fevers. Mild Nausea. No Vomiting or Diarrhea.  NAD Noted during triage. A&Ox4. GCS 15. Ambulatory.

## 2021-06-10 NOTE — Discharge Instructions (Addendum)
As we discussed your symptoms are consistent with a viral upper respiratory infection.  You do not have COVID or the flu.  I encourage plenty fluids, rest. You can use Tylenol for any fever, chills, ibuprofen for any body aches or headache.  You can use your albuterol inhaler as needed if you feel like you are developing some shortness of breath.  If you feel like you have sinus pressure congestion you can consider some over-the-counter saline nose spray, or a Nettie pot.  If your symptoms worsen, you develop significant shortness of breath, chest pain please return to the emergency department for further evaluation.

## 2021-06-10 NOTE — ED Notes (Signed)
Fine on Thursday. Woke up on Friday with the feeling of nose congestion, scratchy throat and nausea. Stated that she tried to power through on Friday at work but all the symptoms increased throughout the day and night. No other complaints.

## 2021-08-16 ENCOUNTER — Encounter: Payer: Medicare Other | Admitting: Psychology

## 2021-08-16 ENCOUNTER — Telehealth: Payer: Self-pay | Admitting: Neurology

## 2021-08-16 DIAGNOSIS — G3184 Mild cognitive impairment, so stated: Secondary | ICD-10-CM

## 2021-08-16 NOTE — Telephone Encounter (Signed)
Patient wants to get her referral for speech therapy resent. ?

## 2021-08-17 NOTE — Telephone Encounter (Signed)
Referral added

## 2021-08-21 ENCOUNTER — Ambulatory Visit: Payer: Medicare Other | Attending: Neurology

## 2021-08-21 DIAGNOSIS — R41841 Cognitive communication deficit: Secondary | ICD-10-CM | POA: Insufficient documentation

## 2021-08-21 NOTE — Patient Instructions (Signed)
   Checkers Chess Connect 4 Uno  Card games Jig saw puzzles Easy cross words Memory match Board games Dominoes Majong Learn a new game!  Listen to and discuss Ted Talks or Podcasts Read and discuss short articles of interest to you- Take notes on these if memory is a challenge Discuss social media posts Look and discuss photo albums  The best activities to improve cognition are functional, real life activities that are important to you:  Plan a menu Participate in household chores and decisions (with supervision) Participate in managing finances Plan a party, trip or tailgate with all of the details (even if you aren't really going to carry it out) Participate in your hobby as you are able with assistance Manage your texts, emails with supervision if needed. Google search for items (even if you're not really going to buy anything) and compare prices and features Socialize -  however, too many visitors can be overwhelming, so set limits "My doctor said I should only visit (or talk) for 20 minutes" or "I do better when I visit with just 1-2 people at a time for 20 minutes"    It's good to use real in-person games, not just apps  Apps:  [employer] Elevate There are apps for most of the games listed above  

## 2021-08-21 NOTE — Therapy (Signed)
[facility] ?[place], [place], [address] ?Phone: [phone]   Fax:  [phone] ? ?Patient Details  ?Name: Emily Ochoa ?MRN: [id number] ?Date of Birth: [birth year] ?Referring Provider:  Philips, Baker Janus, MD ? ?Encounter Date: 08/21/2021 ? ?SPEECH THERAPY DISCHARGE SUMMARY ? ?Visits from Start of Care: 1 ? ?Current functional level related to goals / functional outcomes: ?Pt did not return for tx after ST evaluation.  ?  ?Remaining deficits: ?See eval  ?  ?Education / Equipment: ?Eval results and recommendations  ? ?Patient agrees to discharge. Patient goals were not met. Patient is being discharged due to not returning since the last visit.Marland Kitchen ? ? SLP Short Term Goals - 03/16/21 1615   ?  ?    ?     ?  SLP SHORT TERM GOAL #1  ?  Title Pt will implement memory and attention compensations to aid completion of daily tasks given rare min A over 2 sessions   ?  Time 4   ?  Period Weeks   ?  Status Not met  ?     ?  SLP SHORT TERM GOAL #2  ?  Title Pt complete divided/alternating attention tasks with 3 or less redirections over 2 sessions   ?  Time 4   ?  Period Weeks   ?  Status Not met  ?     ?  SLP SHORT TERM GOAL #3  ?  Title Pt will implement strateiges to aid processing and comprehension in conversation given rare min over 2 sessions   ?  Time 4   ?  Period Weeks   ?  Status Not met  ?     ?  SLP SHORT TERM GOAL #4  ?  Title Pt will complete cognitive communication PROM in first ST session   ?  Time 2   ?  Period Weeks   ?  Status Not met  ?     ?  SLP SHORT TERM GOAL #5  ?  Title Pt will undergo further linguistic assessment for reported word finding and comprehension changes   ?  Time 2   ?  Period Weeks   ?  Status Not met  ?  ?   ?  ?  ?   ?  ?  ?  SLP Long Term Goals - 03/17/21 1641   ?  ?    ?     ?  SLP LONG TERM GOAL #1  ?  Title Pt will utilize compensations and strategies to aid recall and attention without cues from family over  2 sessions   ?  Time 8   ?  Period Weeks   ?  Status Not met  ?     ?  SLP LONG TERM GOAL #2  ?  Title Pt will exhibit WNL language in 20+ minute conversation with rare min A over 2 sessions   ?  Time 8   ?  Period Weeks   ?  Status Not met  ?     ?  SLP LONG TERM GOAL #3  ?  Title Pt will use strategies to aid processing in conversation and during written tasks with independent double check to increase accuracy given rare min A over 2 sessions   ?  Time 8   ?  Period Weeks   ?  Status Not met  ?     ?  SLP LONG TERM GOAL #  4  ?  Title Pt will report improved cognitive communication functioning via PROM by 5 points at last ST session   ?  Time 8   ?  Period Weeks   ?  Status Not met  ?  ? ? ?Marzetta Board, CCC-SLP ?08/21/2021, 8:24 AM ? ? ?[facility] ?[place], [place], [address] ?Phone: [phone]   Fax:  [phone] ?

## 2021-08-21 NOTE — Therapy (Signed)
?OUTPATIENT SPEECH LANGUAGE PATHOLOGY EVALUATION ? ? ?Patient Name: Emily Ochoa ?MRN: [id number] ?DOB:19-Feb-1981, 41 y.o., female ?Today's Date: 08/21/2021 ? ?PCP: System, Provider Not In ?REFERRING PROVIDER: Pieter Partridge, DO ? ? End of Session - 08/21/21 0925   ? ? Visit Number 1   ? Number of Visits 17   ? Date for SLP Re-Evaluation 10/19/21   ? Authorization Type [facility]   ? SLP Start Time 0845   ? SLP Stop Time  0925   ? SLP Time Calculation (min) 40 min   ? Activity Tolerance Patient tolerated treatment well   ? ?  ?  ? ?  ? ? ?Past Medical History:  ?Diagnosis Date  ? Depression   ? Fibromyalgia   ? Hypertension   ? URI (upper respiratory infection)   ? ?Past Surgical History:  ?Procedure Laterality Date  ? BACK SURGERY    ? bioposy in 2009  ? CHOLECYSTECTOMY    ? ?Patient Active Problem List  ? Diagnosis Date Noted  ? GERD (gastroesophageal reflux disease) 11/04/2014  ? Elevated blood sugar 11/03/2014  ? Morbid obesity ([place]) 11/03/2014  ? Depression 11/03/2014  ? Bulging lumbar disc 11/03/2014  ? ? ?ONSET DATE: Fall 2022; memory problems started in 2016 ? 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 ?THERAPY DIAG:  ?Cognitive communication deficit ? ?SUBJECTIVE:  ? ?SUBJECTIVE STATEMENT: ?"Memory problems. It's more short term"  ?Pt accompanied by: self ? ?PERTINENT HISTORY: "Emily Ochoa is a 41 year old female with HTN, fibromyalgia and migraines who presents for migraines and cognitive deficits. Mild neurocognitive disorder - etiology unclear whether it is due to external factors or primary neurologic etiology." ? ?PAIN:  ?Are you having pain? No ? ?FALLS: Has patient fallen in last 6 months?  No ? ?LIVING ENVIRONMENT: ?Lives with: lives with their family - 3 daughters  ?Lives in: House/apartment ? ?PLOF:  ?Level of assistance: Independent with ADLs, Independent with IADLs (declined difficulty with medications & bill pay)  ?Employment: On  disability - not working currently to focus on recovery  ? ? ?PATIENT GOALS "to be the best I can be"  ? ?OBJECTIVE:  ? ?DIAGNOSTIC FINDINGS: "Abnormal white matter on brain MRI - nonspecific findings but given her symptoms, follow up/monitoring needed." ? ?COGNITION: ?Overall cognitive status: Impaired ?Attention: Impaired: Focused, Sustained, Alternating, Divided ?Memory: Impaired: Working ?Short term ?Awareness: WFL ?Executive function: WFL ?Behavior: Within functional limits ?Functional deficits: reported short term recall deficits in conversation and difficulty comprehending while reading  ? ?COGNITIVE COMMUNICATION ?Following directions: Follows multi-step commands consistently  ?Auditory comprehension: WFL ?Verbal expression: WFL ?Functional communication: WFL ? ?ORAL MOTOR EXAMINATION ?Facial : WFL ?Lingual: WFL ?Velum: WFL ?Mandible: WFL ?Cough: WFL ?Voice: WFL ? ?STANDARDIZED ASSESSMENTS: ?SLUMS: 30 (WFL)  ?Previous CLQT in 02/2021: WFL ? ? PATIENT REPORTED OUTCOME MEASURES (PROM): ?Cognitive Function: 102  ?"Sometimes": difficulty concentrating, making simple mistakes more easily, words on "tip of tongue," multitasking, keeping track if interrupted, thinking clearly/more slowly, work really hard to pay attention  ?"Often": reading something several times to understand it  ? ? ?TODAY'S TREATMENT:  ?08-21-21: Educated patient on evaluation results and clinical observations. Initiated brief education (to be completed next session) of attention and memory strategies with handout provided. Pt verbalized understanding of education and agreement with ST intervention to address mild cognitive linguistic deficits.   ? ? ?PATIENT EDUCATION: ?Education details: see above  ?Person educated: Patient ?Education method: Explanation, Demonstration, and Handouts ?Education comprehension: verbalized  understanding, returned demonstration, and needs further education ? ? ? ? ?GOALS: ?Goals reviewed with patient? Yes ? ?SHORT  TERM GOALS: Target date: 09/21/2021 ? ?Pt will implement 2 attention/memory compensations to aid daily functioning given rare min A over 2 sessions  ?Baseline: ?Goal status: INITIAL ? ?2.  Pt complete alternating attention tasks given 3 or less redirections and rare min A over 2 sessions  ?Baseline:  ?Goal status: INITIAL ? ?3.  Pt will implement strateiges to aid processing and comprehension in conversation and while reading short passages given occasional min over 2 sessions   ?Baseline:  ?Goal status: INITIAL ? ? ? ?LONG TERM GOALS: Target date: 10/19/2021 ? ?Pt will utilize 4 attention/memory compensations and strategies to aid recall and attention without cues from family > 1 week ?Baseline:  ?Goal status: INITIAL ? ?2.  Pt will use strategies to aid processing in conversation and during reading tasks given rare min A over 2 sessions  ?Baseline:  ?Goal status: INITIAL ? ?3.  Pt will report improved cognitive communication functioning via PROM by 5 points at last ST session ?Baseline: CF=102 ?Goal status: INITIAL ? ? ?ASSESSMENT: ? ?CLINICAL IMPRESSION: ?Patient is a 41 y.o. female who was seen today for mildcognitive disorder. Pt evaluated by this SLP in November 2022 but unable to attend tx due to work scheduling. Pt is currently no longer working due to cognitive changes, including short term recall.  Pt current experiencing difficulty with recall in conversation and comprehension while reading (reads 2-3x to understand). Brain fog and concentration reportedly improved. Use of compensations (writing down information and alarms) independently implemented for functional household management (bills and meds) with good accuracy reported. Some word finding (corrected with extra time) and rare "slur" reported but improved since last evaluation. Pt would benefit from skilled ST intervention to address mild high level cognitive deficits related to attention and memory to maximize current functioning and optimize return  to work.  ? ?OBJECTIVE IMPAIRMENTS include attention, memory, and receptive language. These impairments are limiting patient from return to work and household responsibilities. ?Factors affecting potential to achieve goals and functional outcome are previous level of function. Patient will benefit from skilled SLP services to address above impairments and improve overall function. ? ?REHAB POTENTIAL: Good ? ?PLAN: ?SLP FREQUENCY: 2x/week ? ?SLP DURATION: 8 weeks ? ?PLANNED INTERVENTIONS: Cueing hierachy, Cognitive reorganization, Internal/external aids, Functional tasks, SLP instruction and feedback, Compensatory strategies, and Patient/family education ? ? ? ?Marzetta Board, CCC-SLP ?08/21/2021, 9:31 AM ? ? ? ? ? ?

## 2021-08-27 ENCOUNTER — Telehealth: Payer: Self-pay | Admitting: Neurology

## 2021-08-27 ENCOUNTER — Encounter: Payer: Medicare Other | Admitting: Psychology

## 2021-08-27 ENCOUNTER — Ambulatory Visit: Payer: Medicare Other | Attending: Neurology

## 2021-08-27 ENCOUNTER — Other Ambulatory Visit: Payer: Self-pay | Admitting: Neurology

## 2021-08-27 DIAGNOSIS — R41841 Cognitive communication deficit: Secondary | ICD-10-CM | POA: Diagnosis present

## 2021-08-27 MED ORDER — DIAZEPAM 5 MG PO TABS: ORAL_TABLET | ORAL | 0 refills | Status: DC

## 2021-08-27 NOTE — Telephone Encounter (Signed)
Patient called and stated she has an MRI on May 4th.  She wanted to see if she could get medication to help her. ?

## 2021-08-27 NOTE — Therapy (Signed)
?OUTPATIENT SPEECH LANGUAGE PATHOLOGY TREATMENT NOTE ? ? ?Patient Name: Emily Ochoa ?MRN: [id number] ?DOB:[birth year], 41 y.o., female ?Today's Date: 08/27/2021 ? ?PCP: N/A ?REFERRING PROVIDER: Drema Dallas, DO  ? ?END OF SESSION:  ? End of Session - 08/27/21 2836   ? ? Visit Number 2   ? Number of Visits 17   ? Date for SLP Re-Evaluation 10/19/21   ? Authorization Type [facility] Medicare/Medicaid [place] Access   ? SLP Start Time 0845   ? SLP Stop Time  0935   ? SLP Time Calculation (min) 50 min   ? Activity Tolerance Patient tolerated treatment well   ? ?  ?  ? ?  ? ? ?Past Medical History:  ?Diagnosis Date  ? Depression   ? Fibromyalgia   ? Hypertension   ? URI (upper respiratory infection)   ? ?Past Surgical History:  ?Procedure Laterality Date  ? BACK SURGERY    ? bioposy in 2009  ? CHOLECYSTECTOMY    ? ?Patient Active Problem List  ? Diagnosis Date Noted  ? GERD (gastroesophageal reflux disease) 11/04/2014  ? Elevated blood sugar 11/03/2014  ? Morbid obesity ([facility]) 11/03/2014  ? Depression 11/03/2014  ? Bulging lumbar disc 11/03/2014  ? ? ?ONSET DATE: Fall 2022; memory problems started in 2026 per chart review ? 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 ?THERAPY DIAG: Cognitive communication deficit ? ?SUBJECTIVE: "I've been doing the CT app" ? ?PAIN:  ?Are you having pain? No ? ? ?OBJECTIVE:  ?TODAY'S TREATMENT:  ?08-27-21: Pt has been consistently completing Constant Therapy exercises targeting cognitive linguistic skills. Reported two episodes of word finding for less familiar words. In review of attention/memory compensations education initiated last session, good implementation reported. Pt purchased agenda and inserted all upcoming appointments with specifics and set two alarms to aid recall. Pt has becoming increasingly aware of how external distractions impact overall cognitive functioning, in which pt has now began initiating modifications to reduce distractions (turning down  television, working in another room, focusing on one task at a time). Structured attention tasks completed with focus on implementing targeted strategies (reading instructions twice to aid attention and comprehension, using attention strategies as needed). Pt completed initial attention with 100% accuracy with good selective and sustained attention exhibited, even with inclusion of mild background stimuli. Second structured sequencing task required increased processing time and occasional min to mod A to ID errors and attend to all important details for more complex sequencing task.  ? ?08-21-21: Educated patient on evaluation results and clinical observations. Initiated brief education (to be completed next session) of attention and memory strategies with handout provided. Pt verbalized understanding of education and agreement with ST intervention to address mild cognitive linguistic deficits.   ?  ?  ?PATIENT EDUCATION: ?Education details: see above  ?Person educated: Patient ?Education method: Explanation, Demonstration, and Handouts ?Education comprehension: verbalized understanding, returned demonstration, and needs further education ?  ?  ?  ?  ?GOALS: ?Goals reviewed with patient? Yes ?  ?SHORT TERM GOALS: Target date: 09/21/2021 ?  ?Pt will implement 2 attention/memory compensations to aid daily functioning given rare min A over 2 sessions  ?Baseline: 08-27-21 (agenda, alarms, attention strats) ?Goal status: ongoing ?  ?2.  Pt complete alternating attention tasks given 3 or less redirections and rare min A over 2 sessions  ?Baseline:  ?Goal status: ongoing ?  ?3.  Pt will implement strateiges to aid processing and comprehension in conversation and while reading short passages given  occasional min over 2 sessions           ?Baseline: 08-27-21 ?Goal status: ongoing ?  ?  ?  ?LONG TERM GOALS: Target date: 10/19/2021 ?  ?Pt will utilize 4 attention/memory compensations and strategies to aid recall and attention without  cues from family > 1 week ?Baseline:  ?Goal status: ongoing ?  ?2.  Pt will use strategies to aid processing in conversation and during reading tasks given rare min A over 2 sessions  ?Baseline:  ?Goal status: ongoing ?  ?3.  Pt will report improved cognitive communication functioning via PROM by 5 points at last ST session ?Baseline: CF=102 ?Goal status: ongoing ?  ?  ?ASSESSMENT: ?  ?CLINICAL IMPRESSION: ?Patient is a 41 y.o. female who was seen today for mildcognitive disorder. Pt evaluated by this SLP in November 2022 but unable to attend tx due to work scheduling. Pt is currently no longer working due to cognitive changes, including short term recall.  Pt current experiencing difficulty with recall in conversation and comprehension while reading (reads 2-3x to understand). Brain fog and concentration reportedly improved. Use of compensations (writing down information and alarms) independently implemented for functional household management (bills and meds) with good accuracy reported. Some word finding (corrected with extra time) and rare "slur" reported but improved since last evaluation. Initiated education and training of memory/attention compensations to aid daily functioning, with good carryover exhibited thus far. Pt would benefit from skilled ST intervention to address mild high level cognitive deficits related to attention and memory to maximize current functioning and optimize return to work.  ?  ?OBJECTIVE IMPAIRMENTS include attention, memory, and receptive language. These impairments are limiting patient from return to work and household responsibilities. ?Factors affecting potential to achieve goals and functional outcome are previous level of function. Patient will benefit from skilled SLP services to address above impairments and improve overall function. ?  ?REHAB POTENTIAL: Good ?  ?PLAN: ?SLP FREQUENCY: 2x/week ?  ?SLP DURATION: 8 weeks ?  ?PLANNED INTERVENTIONS: Cueing hierachy, Cognitive  reorganization, Internal/external aids, Functional tasks, SLP instruction and feedback, Compensatory strategies, and Patient/family education ? ? ?Gracy Racer, CCC-SLP ?08/27/2021, 9:37 AM ? ? ?

## 2021-08-27 NOTE — Telephone Encounter (Signed)
Patient advised of dr.Jaffe note: I sent a script for diazepam.  Please let her know that she must have a driver to and from the MRI as it causes drowsiness.  ? ?Patient advised to take medication 30-60 minutes before MRI.  ?

## 2021-08-28 ENCOUNTER — Encounter (HOSPITAL_BASED_OUTPATIENT_CLINIC_OR_DEPARTMENT_OTHER): Payer: Self-pay

## 2021-08-28 ENCOUNTER — Emergency Department (HOSPITAL_BASED_OUTPATIENT_CLINIC_OR_DEPARTMENT_OTHER): Payer: Medicare Other

## 2021-08-28 ENCOUNTER — Other Ambulatory Visit: Payer: Self-pay

## 2021-08-28 ENCOUNTER — Emergency Department (HOSPITAL_BASED_OUTPATIENT_CLINIC_OR_DEPARTMENT_OTHER)
Admission: EM | Admit: 2021-08-28 | Discharge: 2021-08-28 | Disposition: A | Discharge status: Home / Self Care | Payer: Medicare Other | Attending: Emergency Medicine | Admitting: Emergency Medicine

## 2021-08-28 DIAGNOSIS — I10 Essential (primary) hypertension: Secondary | ICD-10-CM | POA: Insufficient documentation

## 2021-08-28 DIAGNOSIS — R0989 Other specified symptoms and signs involving the circulatory and respiratory systems: Secondary | ICD-10-CM | POA: Diagnosis not present

## 2021-08-28 DIAGNOSIS — Z79899 Other long term (current) drug therapy: Secondary | ICD-10-CM | POA: Diagnosis not present

## 2021-08-28 DIAGNOSIS — R42 Dizziness and giddiness: Secondary | ICD-10-CM | POA: Diagnosis not present

## 2021-08-28 DIAGNOSIS — R110 Nausea: Secondary | ICD-10-CM | POA: Diagnosis not present

## 2021-08-28 DIAGNOSIS — Z91040 Latex allergy status: Secondary | ICD-10-CM | POA: Insufficient documentation

## 2021-08-28 DIAGNOSIS — R0789 Other chest pain: Secondary | ICD-10-CM | POA: Diagnosis present

## 2021-08-28 DIAGNOSIS — J45909 Unspecified asthma, uncomplicated: Secondary | ICD-10-CM | POA: Insufficient documentation

## 2021-08-28 DIAGNOSIS — E876 Hypokalemia: Secondary | ICD-10-CM | POA: Insufficient documentation

## 2021-08-28 DIAGNOSIS — R059 Cough, unspecified: Secondary | ICD-10-CM | POA: Diagnosis not present

## 2021-08-28 DIAGNOSIS — R0981 Nasal congestion: Secondary | ICD-10-CM | POA: Insufficient documentation

## 2021-08-28 LAB — BASIC METABOLIC PANEL
Anion gap: 10 (ref 5–15)
BUN: 10 mg/dL (ref 6–20)
CO2: 24 mmol/L (ref 22–32)
Calcium: 8.9 mg/dL (ref 8.9–10.3)
Chloride: 104 mmol/L (ref 98–111)
Creatinine, Ser: 0.71 mg/dL (ref 0.44–1.00)
GFR, Estimated: >60 mL/min (ref >60)
Glucose, Bld: 108 mg/dL — ABNORMAL HIGH (ref 70–99)
Potassium: 3.3 mmol/L — ABNORMAL LOW (ref 3.5–5.1)
Sodium: 138 mmol/L (ref 135–145)

## 2021-08-28 LAB — CBC
HCT: 37.9 % (ref 36.0–46.0)
Hemoglobin: 11.5 g/dL — ABNORMAL LOW (ref 12.0–15.0)
MCH: 24.6 pg — ABNORMAL LOW (ref 26.0–34.0)
MCHC: 30.3 g/dL (ref 30.0–36.0)
MCV: 81.2 fL (ref 80.0–100.0)
Platelets: 314 10*3/uL (ref 150–400)
RBC: 4.67 MIL/uL (ref 3.87–5.11)
RDW: 16.6 % — ABNORMAL HIGH (ref 11.5–15.5)
WBC: 5 10*3/uL (ref 4.0–10.5)
nRBC: 0 % (ref 0.0–0.2)

## 2021-08-28 LAB — TROPONIN I (HIGH SENSITIVITY)
Troponin I (High Sensitivity): 3 ng/L (ref <18)
Troponin I (High Sensitivity): 3 ng/L (ref <18)

## 2021-08-28 LAB — HCG, SERUM, QUALITATIVE: Preg, Serum: NEGATIVE

## 2021-08-28 IMAGING — DX DG CHEST 1V PORT
1 series · 1 of 1 positions shown · non-contrast
Comparison: Chest radiographs [DATE]

CLINICAL DATA: Mid to left-sided chest pain for 1.5 weeks.

EXAM:
PORTABLE CHEST 1 VIEW

[chest ap]
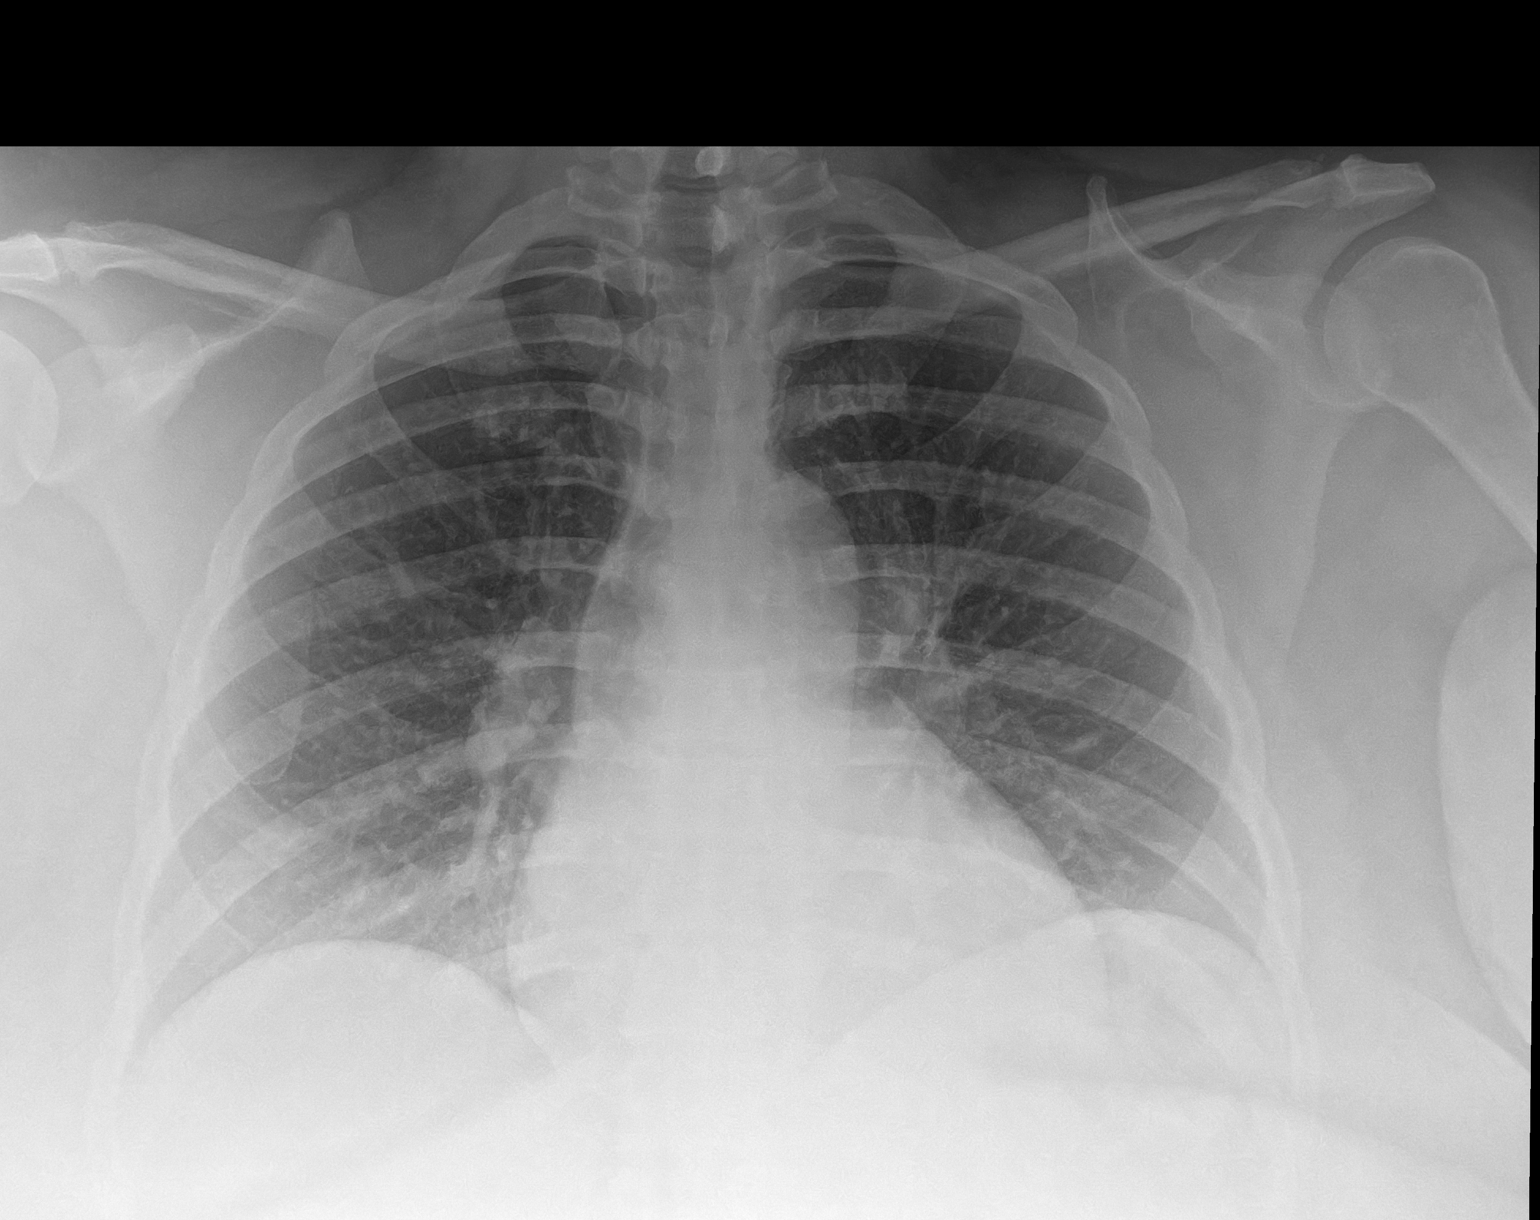

[1 of 1 positions shown; findings below may reference images not displayed]

FINDINGS: The cardiomediastinal silhouette is within normal limits for
portable AP technique. The lungs are mildly hypoinflated. No
airspace consolidation, edema, sizable pleural effusion, or
pneumothorax is evident. No acute osseous abnormality is seen.
IMPRESSION: No active disease.

## 2021-08-28 MED ORDER — IBUPROFEN 400 MG PO TABS
600.0000 mg | ORAL_TABLET | Freq: Once | ORAL | Status: AC
  Administered 2021-08-28: 600 mg via ORAL
  Filled 2021-08-28: qty 1

## 2021-08-28 NOTE — Discharge Instructions (Addendum)
It was a pleasure taking care of you today!  ? ?Your workup was negative in the ED. You may apply ice or heat to the affected area for 15 minutes at a time.  Ensure to place a barrier between your skin and and the ice.  You may use over-the-counter 1,000 mg Tylenol every 6 hours or 600 mg ibuprofen every 6 hours as needed for pain.  Follow-up with your primary care provider for evaluation of your symptoms. If you do not have a primary care provider, you may follow-up with the [facility] as needed.  You may return to the ED if you are experiencing increasing/worsening chest pain, shortness of breath, or worsening symptoms. ? ? ?For your allergy symptoms you may take over-the-counter Zyrtec and Flonase as directed for your symptoms. ?

## 2021-08-28 NOTE — ED Provider Notes (Signed)
?[facility] EMERGENCY DEPT ?Provider Note ? ? ?CSN: [id number] ?Arrival date & time: 08/28/21  1406 ? ?  ? ?History ? ?Chief Complaint  ?Patient presents with  ? Chest Pain  ? ? ?Emily Ochoa is a 41 y.o. female with a past medical history of hypertension who presents to the emergency department complaining of sharp, intermittent mid-lower chest pain onset 1-1.5 weeks.  She notes that her chest pain is exacerbated with movement.  Denies recent injury, trauma, fall.  Has associated nausea (resolved) and lightheadedness.  Has not tried any medications for her symptoms.  Denies fever, chills, abdominal pain, vomiting.  Patient notes she is also been dealing with seasonal allergies and notes rhinorrhea, nasal congestion, cough.  Patient has a history of asthma.  Denies past medical history of diabetes, COPD, MI, cardiac catheterization, CAD, stents. ? ? ?The history is provided by the patient. No language interpreter was used.  ? ?  ? ?Home Medications ?Prior to Admission medications   ?Medication Sig Start Date End Date Taking? Authorizing Provider  ?amoxicillin-clavulanate (AUGMENTIN) 875-125 MG tablet Take 1 tablet by mouth every 12 (twelve) hours. ?Patient not taking: Reported on 06/05/2021 05/17/21   Teressa Lower, PA-C  ?azithromycin (ZITHROMAX) 250 MG tablet Take 1 tablet (250 mg total) by mouth daily. Take first 2 tablets together, then 1 every day until finished. ?Patient not taking: Reported on 12/26/2020 08/31/18   Shaune Pollack, MD  ?diazepam (VALIUM) 5 MG tablet Take 30-40 minutes prior to MRI 08/27/21   Drema Dallas, DO  ?ferrous sulfate 325 (65 FE) MG tablet Take 325 mg by mouth 2 (two) times daily with a meal.    [provider]  ?folic acid (FOLVITE) 800 MCG tablet Take 400 mcg by mouth daily.    [provider]  ?naphazoline-pheniramine (NAPHCON-A) 0.025-0.3 % ophthalmic solution Place 1 drop into the right eye 3 (three) times daily. ?Patient not taking: Reported on  06/05/2021 06/29/20   Zadie Rhine, MD  ?naproxen (NAPROSYN) 375 MG tablet Take 1 tablet (375 mg total) by mouth 2 (two) times daily with a meal. ?Patient not taking: Reported on 06/05/2021 12/26/20   Arthor Captain, PA-C  ?nortriptyline (PAMELOR) 25 MG capsule Take 1 capsule (25 mg total) by mouth at bedtime. 06/05/21   Drema Dallas, DO  ?Omega 3 1200 MG CAPS Take 1,200 mg by mouth 2 (two) times daily.    [provider]  ?ondansetron (ZOFRAN) 4 MG tablet Take 1 tablet (4 mg total) by mouth every 6 (six) hours. 06/10/21   Prosperi, Christian H, PA-C  ?oxyCODONE-acetaminophen (PERCOCET/ROXICET) 5-325 MG tablet Take 1 tablet by mouth every 6 (six) hours as needed for severe pain. ?Patient not taking: Reported on 06/05/2021 05/17/21   Teressa Lower, PA-C  ?polyethylene glycol (MIRALAX / GLYCOLAX) 17 g packet Take 17 g by mouth daily. ?Patient not taking: Reported on 06/05/2021 04/05/21   Andrey Campanile, MD  ?propranolol (INDERAL) 80 MG tablet Take 80 mg by mouth daily.    [provider]  ?   ? ?Allergies    ?Latex, Shellfish allergy, Wheat bran, Almond (diagnostic), Barley grass, Dairycare [lactase-lactobacillus], and Gramineae pollens   ? ?Review of Systems   ?Review of Systems  ?Constitutional:  Negative for chills and fever.  ?HENT:  Positive for congestion and rhinorrhea.   ?Respiratory:  Positive for cough and shortness of breath.   ?Cardiovascular:  Positive for chest pain.  ?Gastrointestinal:  Positive for nausea. Negative for abdominal pain  and vomiting.  ?All other systems reviewed and are negative. ? ?Physical Exam ?Updated Vital Signs ?BP (!) 151/86 (BP Location: Right Arm)   Pulse 73   Temp 97.7 ?F (36.5 ?C) (Temporal)   Resp 16   Ht 5\' 4"  (1.626 m)   Wt (!) 150.6 kg   SpO2 100%   BMI 56.99 kg/m?  ?Physical Exam ?Vitals and nursing note reviewed.  ?Constitutional:   ?   General: She is not in acute distress. ?   Appearance: She is not diaphoretic.  ?   Comments: Patient able to  speak in complete sentences without difficulty.  ?HENT:  ?   Head: Normocephalic and atraumatic.  ?   Mouth/Throat:  ?   Pharynx: No oropharyngeal exudate.  ?Eyes:  ?   General: No scleral icterus. ?   Conjunctiva/sclera: Conjunctivae normal.  ?Cardiovascular:  ?   Rate and Rhythm: Normal rate and regular rhythm.  ?   Pulses: Normal pulses.  ?   Heart sounds: Normal heart sounds.  ?Pulmonary:  ?   Effort: Pulmonary effort is normal. No respiratory distress.  ?   Breath sounds: Normal breath sounds. No wheezing.  ?Chest:  ?   Chest wall: No tenderness.  ?   Comments: No chest wall tenderness to palpation.  No overlying skin changes. ?Abdominal:  ?   General: Bowel sounds are normal.  ?   Palpations: Abdomen is soft. There is no mass.  ?   Tenderness: There is no abdominal tenderness. There is no guarding or rebound.  ?Musculoskeletal:     ?   General: Normal range of motion.  ?   Cervical back: Normal range of motion and neck supple.  ?   Comments: No edema noted to bilateral lower extremities.  Pedal pulses intact bilaterally.  ?Skin: ?   General: Skin is warm and dry.  ?Neurological:  ?   Mental Status: She is alert.  ?Psychiatric:     ?   Behavior: Behavior normal.  ? ? ?ED Results / Procedures / Treatments   ?Labs ?(all labs ordered are listed, but only abnormal results are displayed) ?Labs Reviewed  ?BASIC METABOLIC PANEL - Abnormal; Notable for the following components:  ?    Result Value  ? Potassium 3.3 (*)   ? Glucose, Bld 108 (*)   ? All other components within normal limits  ?CBC - Abnormal; Notable for the following components:  ? Hemoglobin 11.5 (*)   ? MCH 24.6 (*)   ? RDW 16.6 (*)   ? All other components within normal limits  ?HCG, SERUM, QUALITATIVE  ?TROPONIN I (HIGH SENSITIVITY)  ?TROPONIN I (HIGH SENSITIVITY)  ? ? ?EKG ?None ? ?Radiology ?DG Chest Portable 1 View ? ?Result Date: 08/28/2021 ?CLINICAL DATA:  Mid to left-sided chest pain for 1.5 weeks. EXAM: PORTABLE CHEST 1 VIEW COMPARISON:  Chest  radiographs 12/26/2020 FINDINGS: The cardiomediastinal silhouette is within normal limits for portable AP technique. The lungs are mildly hypoinflated. No airspace consolidation, edema, sizable pleural effusion, or pneumothorax is evident. No acute osseous abnormality is seen. IMPRESSION: No active disease. Electronically Signed   By: 12/28/2020 M.D.   On: 08/28/2021 14:38   ? ?Procedures ?Procedures  ? ? ?Medications Ordered in ED ?Medications - No data to display ? ?ED Course/ Medical Decision Making/ A&P ?Clinical Course as of 08/28/21 1755  ?Tue Aug 28, 2021  ?1529 Patient reevaluated and resting comfortably on the stretcher.  Updated patient on lab and imaging findings.  Answered all available questions. [SB]  ?1722 Patient reevaluated and noted to be resting comfortably on stretcher.  Discussed remaining of lab findings.  Discussed discharge treatment plan.  Answered all available questions.  Will provide patient with a warm compress and ibuprofen prior to discharge.  Patient for safe discharge at this time. [SB]  ?  ?Clinical Course User Index ?[SB] Yalanda Soderman A, PA-C  ? ?                        ?Medical Decision Making ?Amount and/or Complexity of Data Reviewed ?Labs: ordered. ?Radiology: ordered. ? ? ?Patient presents to the ED with sharp, intermittent, mid-lower chest pain x 1-1.5 weeks. Pt with a history of HTN and compliant with medications. No prior history of MI, catheterization, stents, or CAD. Vital signs stable,, patient not hypoxic or tachycardic. On exam patient with no chest wall TTP. No pedal edema noted to BLE. Otherwise, no acute cardiovascular, respiratory, abdominal findings. Differential diagnosis includes ACS, aortic dissection, pneumothorax, PNA, costochondritis. ?  ? ?EKG:  ?NSR.  No acute changes compared to previous EKGs. ? ?Labs:  ?I ordered, and personally interpreted labs.  The pertinent results include:   ?Initial troponin and delta troponin at 3  ?CBC unremarkable ?BMP with  slightly decreased potassium at 3.3, otherwise unremarkable. ?Negative pregnancy hCG ? ?Imaging: ?I ordered imaging studies including CXR ?I independently visualized and interpreted imaging which showed: No acute card

## 2021-08-28 NOTE — ED Triage Notes (Signed)
Patient here POV from Home. ? ?Endorses Mid/Left CP for approximately 1-1.5 weeks that has been progressively worsening since it began. Intermittent and Sharp in nature. ? ?Mild Cough which Patient attributes to Allergies. No Fevers. Mild Nausea and Mild SOB this AM. ? ?NAD Noted during Triage. A&Ox4. GCS 15. Ambulatory. ?

## 2021-08-29 ENCOUNTER — Ambulatory Visit: Payer: Medicare Other | Admitting: Speech Pathology

## 2021-08-29 ENCOUNTER — Encounter: Payer: Self-pay | Admitting: Speech Pathology

## 2021-08-29 DIAGNOSIS — R41841 Cognitive communication deficit: Secondary | ICD-10-CM

## 2021-08-29 NOTE — Patient Instructions (Addendum)
? ?  Ask for information in writing if it is important - ask for an email or text ? ?Slow processing strategies are similar to what you've been doing limiting distractions, not multi tasking during conversations of phone calls ? ?Repeat back what you heard "What I heard you say is...." I need t to make sure I got everything that you said ? ?Write it down immediately ? ?Have important conversation earlier in the day - later in the day you are more fatigued and it will be harder to attend to the conversation ? ?Group conversations will be harder to process as the topic changes rapidly ? ?Limit groups to 2-3 other people ? ?Attention is the gateway to memory ? ?You look great - people, including family, will forget that you are having cognitive difficulties ? ?Perimenopause - look it up ? ? ?

## 2021-08-29 NOTE — Therapy (Signed)
?OUTPATIENT SPEECH LANGUAGE PATHOLOGY TREATMENT NOTE ? ? ?Patient Name: Emily Ochoa ?MRN: [id number] ?DOB:[birth year], 41 y.o., female ?Today's Date: 08/29/2021 ? ?PCP: N/A ?REFERRING PROVIDER: Pieter Partridge, DO  ? ?END OF SESSION:  ? End of Session - 08/29/21 1013   ? ? Visit Number 3   ? Number of Visits 17   ? Date for SLP Re-Evaluation 10/19/21   ? Authorization Type [facility]   ? SLP Start Time 0930   ? SLP Stop Time  1013   ? SLP Time Calculation (min) 43 min   ? Activity Tolerance Patient tolerated treatment well   ? ?  ?  ? ?  ? ? ?Past Medical History:  ?Diagnosis Date  ? Depression   ? Fibromyalgia   ? Hypertension   ? URI (upper respiratory infection)   ? ?Past Surgical History:  ?Procedure Laterality Date  ? BACK SURGERY    ? bioposy in 2009  ? CHOLECYSTECTOMY    ? ?Patient Active Problem List  ? Diagnosis Date Noted  ? GERD (gastroesophageal reflux disease) 11/04/2014  ? Elevated blood sugar 11/03/2014  ? Morbid obesity ([place]) 11/03/2014  ? Depression 11/03/2014  ? Bulging lumbar disc 11/03/2014  ? ? ?ONSET DATE: Fall 2022; memory problems started in 2026 per chart review ? 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 ?THERAPY DIAG: Cognitive communication deficit ? ?SUBJECTIVE: "II went to the ED yesterday" ? ?PAIN:  ?Are you having pain? No ? ? ?OBJECTIVE:  ?TODAY'S TREATMENT:  ? ?08-29-21:  Saba went to ED yesterday with chest pain. They recommended PT, I suggested she get a referral here so she doesn't have to manage 2 places and appointment times. She verbalized carryover of 4 strategies for attention. Trained in compensations for slow processing of conversations - see pt instructions. She endorses asking her children to text her important information and writing down important information immediately after a conversation or call. Trained in use of compensations for word finding, including use of synonyms and descriptions. Complex naming task  alternating attention between naming and conversation over 15 minutes with 1 verbal cue to recall letter she was to use to name items. She returned to task with rare min A. In complex naming task, Aryani named 20/24 words with extended time and occasional min A.  ? ? ?08-27-21: Pt has been consistently completing Constant Therapy exercises targeting cognitive linguistic skills. Reported two episodes of word finding for less familiar words. In review of attention/memory compensations education initiated last session, good implementation reported. Pt purchased agenda and inserted all upcoming appointments with specifics and set two alarms to aid recall. Pt has becoming increasingly aware of how external distractions impact overall cognitive functioning, in which pt has now began initiating modifications to reduce distractions (turning down television, working in another room, focusing on one task at a time). Structured attention tasks completed with focus on implementing targeted strategies (reading instructions twice to aid attention and comprehension, using attention strategies as needed). Pt completed initial attention with 100% accuracy with good selective and sustained attention exhibited, even with inclusion of mild background stimuli. Second structured sequencing task required increased processing time and occasional min to mod A to ID errors and attend to all important details for more complex sequencing task.  ? ?08-21-21: Educated patient on evaluation results and clinical observations. Initiated brief education (to be completed next session) of attention and memory strategies with handout provided. Pt verbalized understanding of education and agreement with  ST intervention to address mild cognitive linguistic deficits.   ?  ?  ?PATIENT EDUCATION: ?Education details: see above  ?Person educated: Patient ?Education method: Explanation, Demonstration, and Handouts ?Education comprehension: verbalized  understanding, returned demonstration, and needs further education ?  ?  ?  ?  ?GOALS: ?Goals reviewed with patient? Yes ?  ?SHORT TERM GOALS: Target date: 09/21/2021 ?  ?Pt will implement 2 attention/memory compensations to aid daily functioning given rare min A over 2 sessions  ?Baseline: 08-27-21 (agenda, alarms, attention strats) ?Goal status: ongoing ?  ?2.  Pt complete alternating attention tasks given 3 or less redirections and rare min A over 2 sessions  ?Baseline:  ?Goal status: ongoing ?  ?3.  Pt will implement strateiges to aid processing and comprehension in conversation and while reading short passages given occasional min over 2 sessions           ?Baseline: 08-27-21 ?Goal status: ongoing ?  ?  ?  ?LONG TERM GOALS: Target date: 10/19/2021 ?  ?Pt will utilize 4 attention/memory compensations and strategies to aid recall and attention without cues from family > 1 week ?Baseline:  ?Goal status: ongoing ?  ?2.  Pt will use strategies to aid processing in conversation and during reading tasks given rare min A over 2 sessions  ?Baseline:  ?Goal status: ongoing ?  ?3.  Pt will report improved cognitive communication functioning via PROM by 5 points at last ST session ?Baseline: CF=102 ?Goal status: ongoing ?  ?  ?ASSESSMENT: ?  ?CLINICAL IMPRESSION: ?Patient is a 41 y.o. female who was seen today for mildcognitive disorder. Pt evaluated by this SLP in November 2022 but unable to attend tx due to work scheduling. Pt is currently no longer working due to cognitive changes, including short term recall.  Pt current experiencing difficulty with recall in conversation and comprehension while reading (reads 2-3x to understand). Brain fog and concentration reportedly improved. Use of compensations (writing down information and alarms) independently implemented for functional household management (bills and meds) with good accuracy reported. Some word finding (corrected with extra time) and rare "slur" reported but improved  since last evaluation. Initiated education and training of memory/attention compensations to aid daily functioning, with good carryover exhibited thus far. Pt would benefit from skilled ST intervention to address mild high level cognitive deficits related to attention and memory to maximize current functioning and optimize return to work.  ?  ?OBJECTIVE IMPAIRMENTS include attention, memory, and receptive language. These impairments are limiting patient from return to work and household responsibilities. ?Factors affecting potential to achieve goals and functional outcome are previous level of function. Patient will benefit from skilled SLP services to address above impairments and improve overall function. ?  ?REHAB POTENTIAL: Good ?  ?PLAN: ?SLP FREQUENCY: 2x/week ?  ?SLP DURATION: 8 weeks ?  ?PLANNED INTERVENTIONS: Cueing hierachy, Cognitive reorganization, Internal/external aids, Functional tasks, SLP instruction and feedback, Compensatory strategies, and Patient/family education ? ? ?Ezekiel Menzer, Annye Rusk, CCC-SLP ?08/29/2021, 10:13 AM ? ? ?

## 2021-08-30 ENCOUNTER — Ambulatory Visit
Admission: RE | Admit: 2021-08-30 | Discharge: 2021-08-30 | Disposition: A | Discharge status: Home / Self Care | Payer: Medicare Other | Source: Ambulatory Visit | Attending: Neurology | Admitting: Neurology

## 2021-08-30 ENCOUNTER — Ambulatory Visit: Payer: Medicare Other | Admitting: Internal Medicine

## 2021-08-30 ENCOUNTER — Telehealth: Payer: Self-pay | Admitting: Neurology

## 2021-08-30 DIAGNOSIS — R9089 Other abnormal findings on diagnostic imaging of central nervous system: Secondary | ICD-10-CM

## 2021-08-30 IMAGING — MR MR HEAD WO/W CM
14 series · 48 of 48 positions shown · IV contrast (multihance)
Comparison: Brain MRI [DATE]

CLINICAL DATA: Leg cramps with stinging for 1 year, confusion,
brain fog.

EXAM:
MRI HEAD WITHOUT AND WITH CONTRAST
TECHNIQUE: Multiplanar, multiecho pulse sequences of the brain and surrounding
structures were obtained without and with intravenous contrast.
CONTRAST:  20mL MULTIHANCE GADOBENATE DIMEGLUMINE 529 MG/ML IV SOLN

[Series 2: T1 · sagittal · 5.0mm · 0.45mm/px · 1 of 25 slices shown]
[im 1/25]
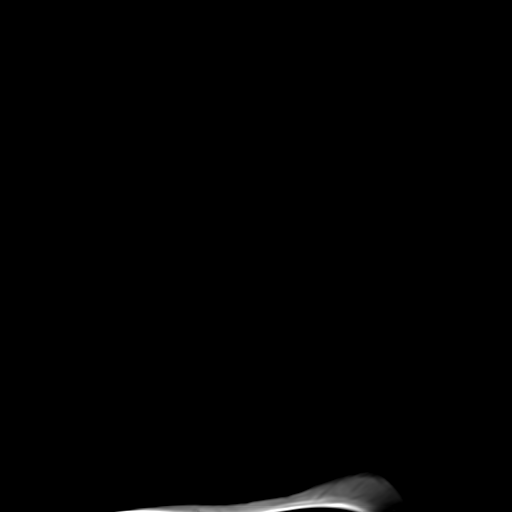

[Series 3: ax ep2d_diff_3 · axial · 3.0mm · 1.80mm/px · z∈[-127,+34]mm · 6 of 106 slices shown]
[im 1/106]
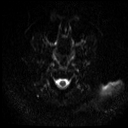
[im 22/106]
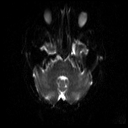
[im 43/106]
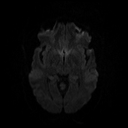
[im 64/106]
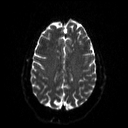
[im 85/106]
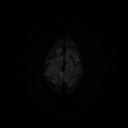
[im 106/106]
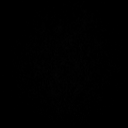

[Series 4: ax ep2d_diff_3_adc · axial · 3.0mm · 1.80mm/px · z∈[-127,+31]mm · 3 of 54 slices shown]
[im 1/54]
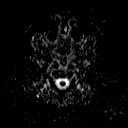
[im 27/54]
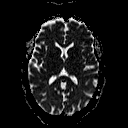
[im 54/54]
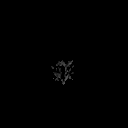

[Series 5: cor ep2d_diff · coronal · 5.0mm · 1.77mm/px · 3 of 59 slices shown]
[im 1/59]
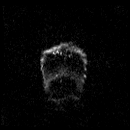
[im 30/59]
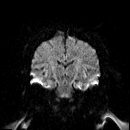
[im 59/59]
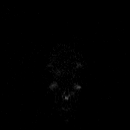

[Series 6: cor ep2d_diff_adc · coronal · 5.0mm · 1.77mm/px · 2 of 30 slices shown]
[im 1/30]
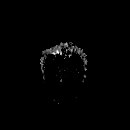
[im 30/30]
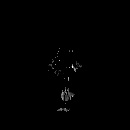

[Series 8: swi_images · axial · 2.0mm · 0.98mm/px · z∈[-123,+34]mm · 4 of 80 slices shown]
[im 1/80]
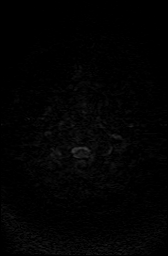
[im 27/80]
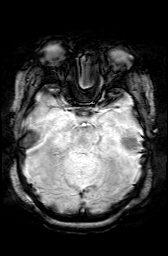
[im 53/80]
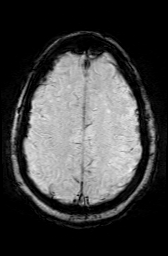
[im 80/80]
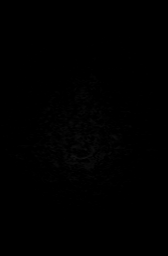

[Series 9: FLAIR · axial · 3.0mm · 0.43mm/px · z∈[-122,+29]mm · 2 of 40 slices shown (1 of 2)]
[im 1/40]
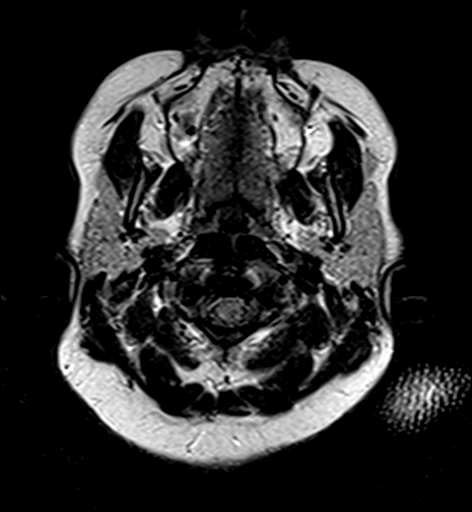
[im 40/40]
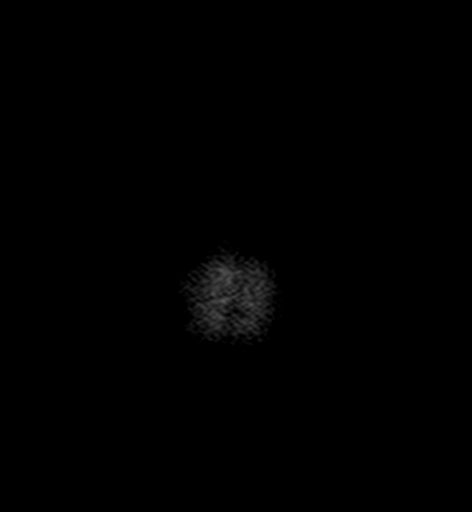

[Series 10: T2 · axial · 5.0mm · 0.65mm/px · z∈[-128,+39]mm · 2 of 29 slices shown (1 of 2)]
[im 1/29]
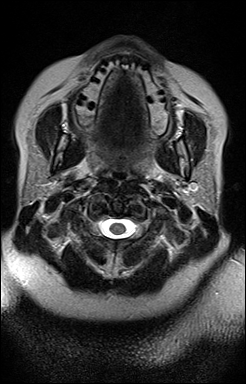
[im 29/29]
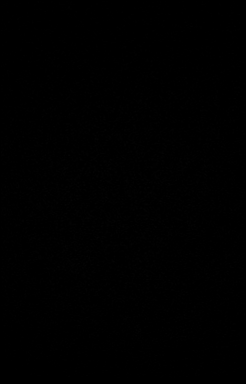

[Series 11: t1_mpr_tra · axial · 1.0mm · 0.78mm/px · z∈[-126,+32]mm · 9 of 160 slices shown]
[im 1/160]
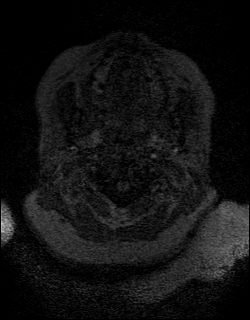
[im 20/160]
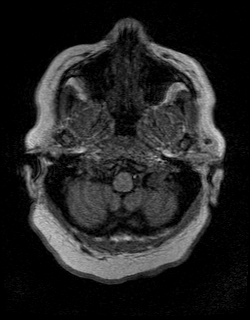
[im 40/160]
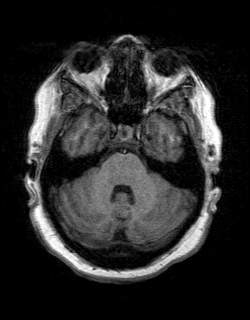
[im 60/160]
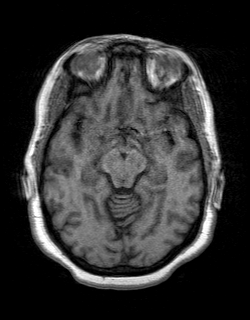
[im 80/160]
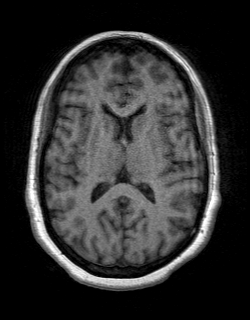
[im 100/160]
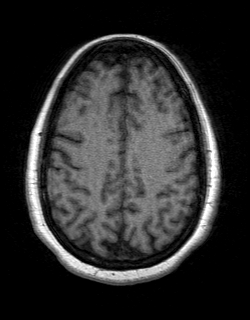
[im 120/160]
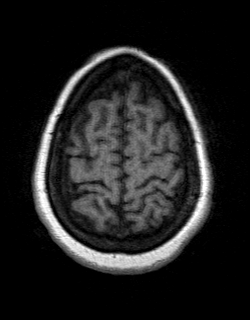
[im 140/160]
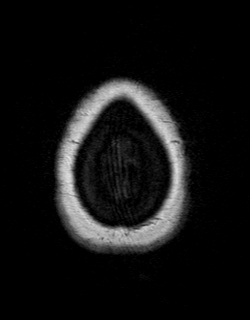
[im 160/160]
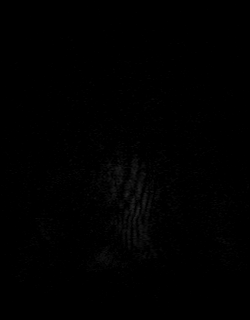

[Series 12: FLAIR · sagittal · 3.0mm · 0.43mm/px · 2 of 40 slices shown (2 of 2)]
[im 1/40]
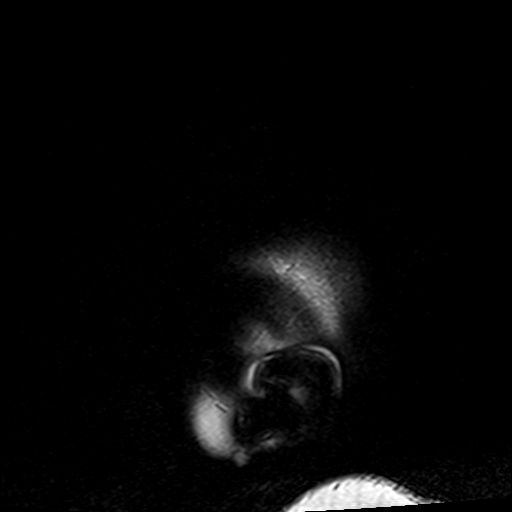
[im 40/40]
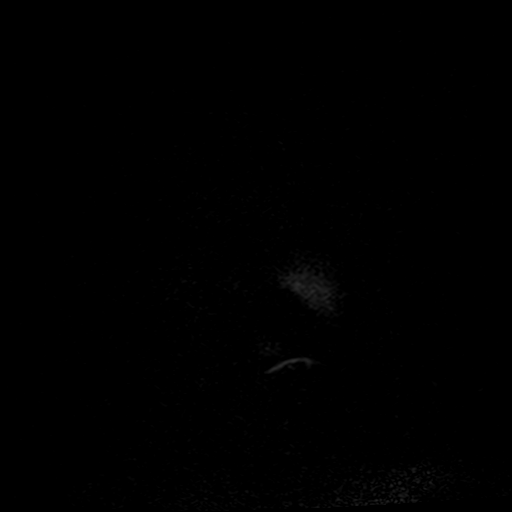

[Series 14: post t1_mpr_tra · axial · 1.0mm · 0.78mm/px · z∈[-126,+32]mm · 9 of 160 slices shown]
[im 1/160]
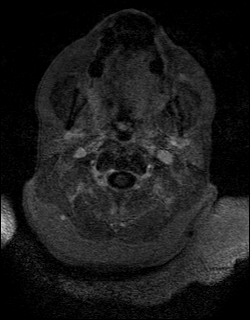
[im 20/160]
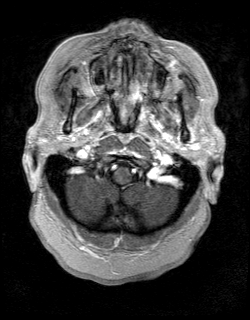
[im 40/160]
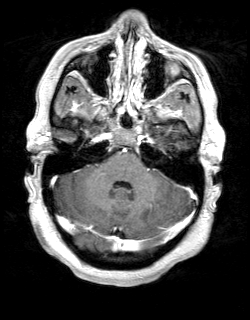
[im 60/160]
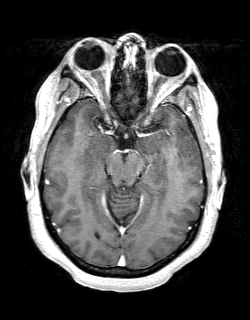
[im 80/160]
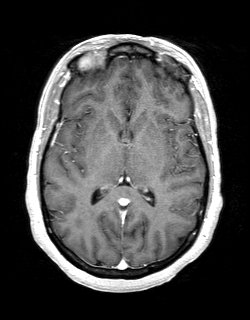
[im 100/160]
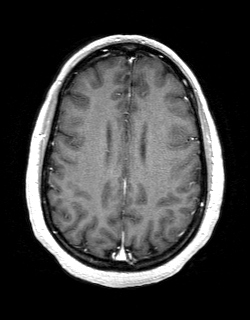
[im 120/160]
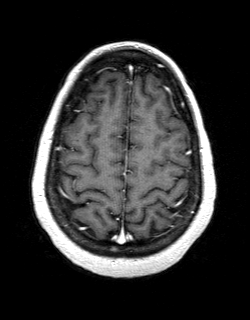
[im 140/160]
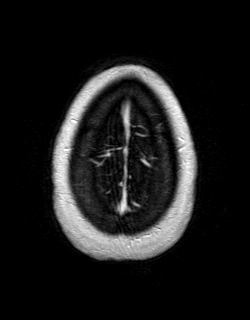
[im 160/160]
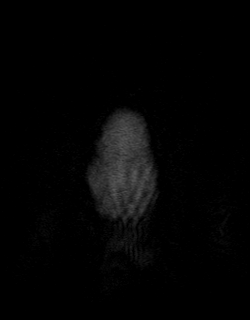

[Series 15: T1 post-contrast · coronal · 5.0mm · 0.72mm/px · 2 of 33 slices shown]
[im 1/33]
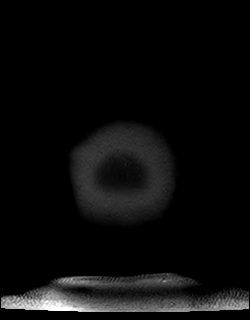
[im 33/33]
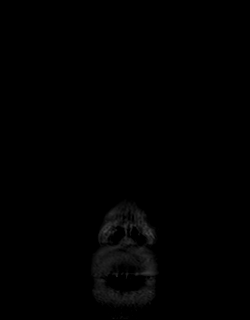

[Series 16: t1_se_sag post · sagittal · 5.0mm · 0.45mm/px · 1 of 25 slices shown]
[im 1/25]
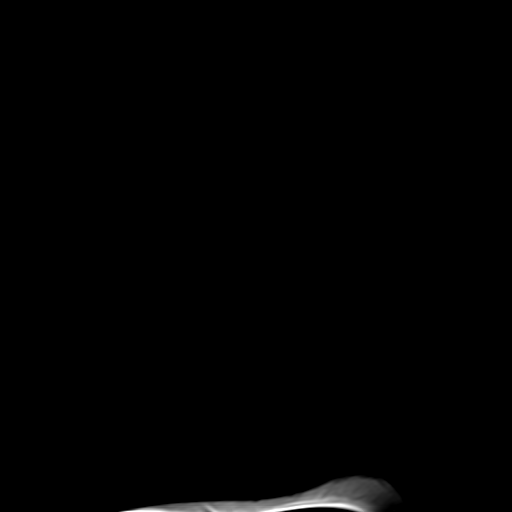

[Series 17: T2 · coronal · 5.0mm · 0.43mm/px · 2 of 33 slices shown (2 of 2)]
[im 1/33]
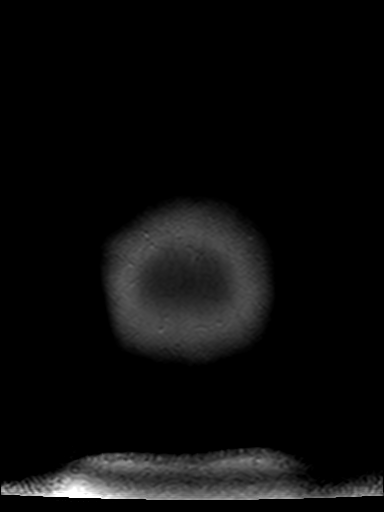
[im 33/33]
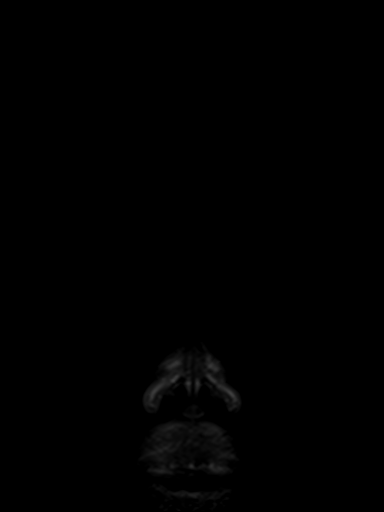

[48 of 48 positions shown; findings below may reference images not displayed]

FINDINGS: Brain: There is no acute intracranial hemorrhage, extra-axial fluid
collection, or acute infarct.

Parenchymal volume is normal. The ventricles are normal in size.
Gray-white differentiation is preserved. There are scattered small
foci of FLAIR signal abnormality in the subcortical and
periventricular white matter, unchanged.

There is no abnormal enhancement. There is no mass lesion. There is
no mass effect or midline shift.

There is an expanded and partially empty sella, unchanged.

Vascular: Normal flow voids.

Skull and upper cervical spine: Normal marrow signal.

Sinuses/Orbits: The paranasal sinuses are clear. The globes and
orbits are unremarkable.

Other: None.
IMPRESSION: 1. No acute intracranial pathology.
2. Unchanged scattered foci of FLAIR signal abnormality in the
subcortical and periventricular white matter, nonspecific but most
commonly seen in the setting of chronic small vessel disease, with
differential including sequela of migraines, prior insult, or
demyelinating disease (though note that the appearance and
distribution is not typical for sclerosis).
3. Expanded and partially empty sella. This can be a normal
variant,7 but also can be seen in the setting of idiopathic internal
hypertension.

## 2021-08-30 MED ORDER — GADOBENATE DIMEGLUMINE 529 MG/ML IV SOLN
20.0000 mL | Freq: Once | INTRAVENOUS | Status: AC | PRN
  Administered 2021-08-30: 20 mL via INTRAVENOUS

## 2021-08-30 NOTE — Telephone Encounter (Signed)
Pt called in and left a message. She is having severe leg cramps and twitches all over her body. She would like to see if she can schedule an appt or see if Dr. Everlena Cooper would call something in for her? ?

## 2021-08-30 NOTE — Progress Notes (Deleted)
Cardiology Office Note:    Date:  08/30/2021   ID:  Emily Ochoa, DOB [birth year], MRN [id number]  PCP:  System, Provider Not In   [facility] Providers Cardiologist:  None { Click to update primary MD,subspecialty MD or APP then REFRESH:1}    Referring MD: Blue, Soijett A, PA-C   No chief complaint on file. ***  History of Present Illness:    Emily Ochoa is a 41 y.o. female with a hx of depression, fibromyalgia and atypical CP  Patient was seen in the ED. Noted exertional CP for 1-1.5 weeks. Also has a URI. She has no cardiac disease and low risk. EKG had no ischemic changes. Troponin negative   Past Medical History:  Diagnosis Date   Depression    Fibromyalgia    Hypertension    URI (upper respiratory infection)     Past Surgical History:  Procedure Laterality Date   BACK SURGERY     bioposy in 2009   CHOLECYSTECTOMY      Current Medications: No outpatient medications have been marked as taking for the 08/30/21 encounter (Appointment) with Maisie Fus, MD.     Allergies:   Latex, Shellfish allergy, Wheat bran, Almond (diagnostic), Barley grass, Dairycare [lactase-lactobacillus], and Gramineae pollens   Social History   Socioeconomic History   Marital status: Divorced    Spouse name: Not on file   Number of children: Not on file   Years of education: Not on file   Highest education level: Not on file  Occupational History   Not on file  Tobacco Use   Smoking status: Never   Smokeless tobacco: Never  Substance and Sexual Activity   Alcohol use: No   Drug use: No   Sexual activity: Never  Other Topics Concern   Not on file  Social History Narrative   Right handed   Social Determinants of Health   Financial Resource Strain: Not on file  Food Insecurity: Not on file  Transportation Needs: Not on file  Physical Activity: Not on file  Stress: Not on file  Social Connections: Not on file     Family History: The patient's ***family  history includes Birth defects in her maternal grandfather; Dementia in her maternal grandmother and paternal grandfather; Diabetes in her father and sister; Hyperlipidemia in her father; Hypertension in her mother.  ROS:   Please see the history of present illness.    *** All other systems reviewed and are negative.  EKGs/Labs/Other Studies Reviewed:    The following studies were reviewed today: ***  EKG:  EKG is *** ordered today.  The ekg ordered today demonstrates ***  Recent Labs: 08/28/2021: BUN 10; Creatinine, Ser 0.71; Hemoglobin 11.5; Platelets 314; Potassium 3.3; Sodium 138  Recent Lipid Panel    Component Value Date/Time   CHOL 153 11/03/2014 1618   TRIG 96 11/03/2014 1618   HDL 55 11/03/2014 1618   CHOLHDL 2.8 11/03/2014 1618   VLDL 19 11/03/2014 1618   LDLCALC 79 11/03/2014 1618     Risk Assessment/Calculations:   {Does this patient have ATRIAL FIBRILLATION?:[phone]}       Physical Exam:    VS:  There were no vitals taken for this visit.    Wt Readings from Last 3 Encounters:  08/28/21 (!) 332 lb 0.2 oz (150.6 kg)  06/10/21 (!) 332 lb 0.2 oz (150.6 kg)  06/05/21 (!) 332 lb (150.6 kg)     GEN: *** Well nourished, well developed in no acute distress HEENT: Normal  NECK: No JVD; No carotid bruits LYMPHATICS: No lymphadenopathy CARDIAC: ***RRR, no murmurs, rubs, gallops RESPIRATORY:  Clear to auscultation without rales, wheezing or rhonchi  ABDOMEN: Soft, non-tender, non-distended MUSCULOSKELETAL:  No edema; No deformity  SKIN: Warm and dry NEUROLOGIC:  Alert and oriented x 3 PSYCHIATRIC:  Normal affect   ASSESSMENT:    Atypical CP: ACS was r/o. The nature of the pain is very atypical. Her EKG today was normal. Her cardiac risk is low. Consider the low pretest probability and atypical pain will not recommend further testing.  PLAN:    In order of problems listed above:  No further cardiac evaluation indicated at this time      {Are you  ordering a CV Procedure (e.g. stress test, cath, DCCV, TEE, etc)?   Press F2        :[id number]}    Medication Adjustments/Labs and Tests Ordered: Current medicines are reviewed at length with the patient today.  Concerns regarding medicines are outlined above.  No orders of the defined types were placed in this encounter.  No orders of the defined types were placed in this encounter.   There are no Patient Instructions on file for this visit.   Signed, Maisie Fus, MD  08/30/2021 7:56 AM    [facility]

## 2021-08-31 MED ORDER — BACLOFEN 10 MG PO TABS: 10.0000 mg | ORAL_TABLET | Freq: Three times a day (TID) | ORAL | 0 refills | Status: DC

## 2021-08-31 NOTE — Telephone Encounter (Signed)
Per Dr.Jaffe, ?Please send prescription for baclofen 10mg  three times daily as needed.  ? ?Script sent in.  ?

## 2021-08-31 NOTE — Telephone Encounter (Signed)
Patient left message with access nurse on 5/5 at 7:26 AM.  Stated she is having bad leg cramps. ?

## 2021-09-03 ENCOUNTER — Other Ambulatory Visit (INDEPENDENT_AMBULATORY_CARE_PROVIDER_SITE_OTHER): Payer: Medicare Other

## 2021-09-03 ENCOUNTER — Telehealth: Payer: Self-pay

## 2021-09-03 DIAGNOSIS — R9089 Other abnormal findings on diagnostic imaging of central nervous system: Secondary | ICD-10-CM | POA: Diagnosis not present

## 2021-09-03 NOTE — Telephone Encounter (Signed)
-----   Message from Drema Dallas, DO sent at 08/31/2021 11:13 AM EDT ----- ?MRI is stable compared to last year.  I would like to check some more labs - SSA/SSB antibodies and pan-ANCA panel ?

## 2021-09-03 NOTE — Telephone Encounter (Signed)
MRI of the brain results given to patient, Labs ordered.  ?Your provider has requested that you have labwork completed today. Please go to [facility] ([id number]) on the second floor of this building before leaving the office today. You do not need to check in. If you are not called within 15 minutes please check with the front desk.   ?

## 2021-09-04 ENCOUNTER — Ambulatory Visit (INDEPENDENT_AMBULATORY_CARE_PROVIDER_SITE_OTHER): Payer: Medicare Other | Admitting: Internal Medicine

## 2021-09-04 ENCOUNTER — Encounter: Payer: Self-pay | Admitting: Internal Medicine

## 2021-09-04 VITALS — BP 130/76 | HR 65 | Ht 63.0 in | Wt 360.0 lb

## 2021-09-04 DIAGNOSIS — R0789 Other chest pain: Secondary | ICD-10-CM | POA: Diagnosis not present

## 2021-09-04 NOTE — Patient Instructions (Signed)
Medication Instructions:  Your physician recommends that you continue on your current medications as directed. Please refer to the Current Medication list given to you today.  Follow-Up: At [facility], you and your health needs are our priority.  As part of our continuing mission to provide you with exceptional heart care, we have created designated Provider Care Teams.  These Care Teams include your primary Cardiologist (physician) and Advanced Practice Providers (APPs -  Physician Assistants and Nurse Practitioners) who all work together to provide you with the care you need, when you need it.  We recommend signing up for the patient portal called "MyChart".  Sign up information is provided on this After Visit Summary.  MyChart is used to connect with patients for Virtual Visits (Telemedicine).  Patients are able to view lab/test results, encounter notes, upcoming appointments, etc.  Non-urgent messages can be sent to your provider as well.   To learn more about what you can do with MyChart, go to [email or web address].    Your next appointment:   AS NEEDED with Dr. Branch   Important Information About Sugar     ]\  

## 2021-09-04 NOTE — Progress Notes (Signed)
?Cardiology Office Note:   ? ?Date:  09/04/2021  ? ?ID:  Emily Ochoa, DOB 02-05-1981, MRN [id number] ? ?PCP:  System, Provider Not In ?  ?[facility] Providers ?Cardiologist:  None    ? ?Referring MD: Cyndee Brightly, Soijett A, PA-C  ? ?No chief complaint on file. ?Atypical CP ? ?History of Present Illness:   ? ?Emily Ochoa is a 41 y.o. female with a hx of fibromyalgia referral for atypical sharp chest pain  ? ?She was seen in the ED for atypical CP. She noted that night she had a hard time breathing. Her chest was sore. Lasted for that day.  She feels better today. SShe denies chest pressure and no significant SOB with activity. No family of CAD. No smoking. Her pain is intermittent. She has hx of fibromyalgia. ? ?Past Medical History:  ?Diagnosis Date  ? Depression   ? Fibromyalgia   ? Hypertension   ? URI (upper respiratory infection)   ? ? ?Past Surgical History:  ?Procedure Laterality Date  ? BACK SURGERY    ? bioposy in 2009  ? CHOLECYSTECTOMY    ? ? ?Current Medications: ?Current Meds  ?Medication Sig  ? albuterol (VENTOLIN HFA) 108 (90 Base) MCG/ACT inhaler INHALE 2 PUFFS BY MOUTH EVERY 4 TO 6 HOURS AS NEEDED FOR COUGH FOR WHEEZING FOR SHORTNESS OF BREATH  ? baclofen (LIORESAL) 10 MG tablet Take 1 tablet (10 mg total) by mouth 3 (three) times daily.  ? BD PEN NEEDLE NANO 2ND GEN 32G X 4 MM MISC FOR USE WITH VICTOZA ONCE A DAY ONCE DAILY  ? ergocalciferol (VITAMIN D2) 1.25 MG (50000 UT) capsule TAKE 1 CAPSULE BY MOUTH ONCE A WEEK FOR 12 WEEKS  ? fluticasone (FLONASE) 50 MCG/ACT nasal spray Use 1 spray in each nostril 2 times daily for 5 days.  ? ibuprofen (ADVIL) 800 MG tablet Take by mouth.  ? loratadine (CLARITIN) 10 MG tablet TAKE 1 TABLET BY MOUTH ONCE DAILY AT NIGHT  ? montelukast (SINGULAIR) 10 MG tablet Take 10 mg by mouth at bedtime.  ? omeprazole (PRILOSEC) 20 MG capsule Take 20 mg by mouth daily as needed.  ? propranolol (INDERAL) 80 MG tablet Take 80 mg by mouth daily.  ? tiZANidine (ZANAFLEX) 2  MG tablet Take 1 tab po BID as needed for muscle spasms; caution may cause sedation  ? valACYclovir (VALTREX) 1000 MG tablet Take 1,000 mg by mouth daily.  ? VICTOZA 18 MG/3ML SOPN Inject into the skin.  ?  ? ?Allergies:   Latex, Shellfish allergy, Wheat bran, Almond (diagnostic), Barley grass, Dairycare [lactase-lactobacillus], and Gramineae pollens  ? ?Social History  ? ?Socioeconomic History  ? Marital status: Divorced  ?  Spouse name: Not on file  ? Number of children: Not on file  ? Years of education: Not on file  ? Highest education level: Not on file  ?Occupational History  ? Not on file  ?Tobacco Use  ? Smoking status: Never  ? Smokeless tobacco: Never  ?Substance and Sexual Activity  ? Alcohol use: No  ? Drug use: No  ? Sexual activity: Never  ?Other Topics Concern  ? Not on file  ?Social History Narrative  ? Right handed  ? ?Social Determinants of Health  ? ?Financial Resource Strain: Not on file  ?Food Insecurity: Not on file  ?Transportation Needs: Not on file  ?Physical Activity: Not on file  ?Stress: Not on file  ?Social Connections: Not on file  ?  ? ?Family History: ?The patient's  family history includes Birth defects in her maternal grandfather; Dementia in her maternal grandmother and paternal grandfather; Diabetes in her father and sister; Hyperlipidemia in her father; Hypertension in her mother. ? ?ROS:   ?Please see the history of present illness.    ? All other systems reviewed and are negative. ? ?EKGs/Labs/Other Studies Reviewed:   ? ?The following studies were reviewed today: ? ? ?EKG:  EKG is  ordered today.  The ekg ordered today demonstrates  ? ?5/9-NSR ? ?Recent Labs: ?08/28/2021: BUN 10; Creatinine, Ser 0.71; Hemoglobin 11.5; Platelets 314; Potassium 3.3; Sodium 138  ?Recent Lipid Panel ?   ?Component Value Date/Time  ? CHOL 153 11/03/2014 1618  ? TRIG 96 11/03/2014 1618  ? HDL 55 11/03/2014 1618  ? CHOLHDL 2.8 11/03/2014 1618  ? VLDL 19 11/03/2014 1618  ? LDLCALC 79 11/03/2014 1618   ? ? ? ?Risk Assessment/Calculations:   ?  ? ?    ? ?Physical Exam:   ? ?VS:   ? ?Vitals:  ? 09/04/21 1135  ?BP: 130/76  ?Pulse: 65  ?SpO2: 100%  ? ? ? ?Wt Readings from Last 3 Encounters:  ?09/04/21 (!) 360 lb (163.3 kg)  ?08/28/21 (!) 332 lb 0.2 oz (150.6 kg)  ?06/10/21 (!) 332 lb 0.2 oz (150.6 kg)  ?  ? ?GEN:  Morbidly obese ?HEENT: Normal ?NECK: No JVD; No carotid bruits ?LYMPHATICS: No lymphadenopathy ?CARDIAC: RRR, no murmurs, rubs, gallops ?RESPIRATORY:  Clear to auscultation without rales, wheezing or rhonchi  ?ABDOMEN: Soft, non-tender, non-distended ?MUSCULOSKELETAL:  No edema; No deformity  ?SKIN: Warm and dry ?NEUROLOGIC:  Alert and oriented x 3 ?PSYCHIATRIC:  Normal affect  ? ?ASSESSMENT:   ? ?Atypical CP: The nature of the pain is very atypical. Her EKG today was normal. We discussed the importance of weightloss. She notes considering victoza but she is concerned about side effects. We discussed establishing with a PCP and consider working with a nutritionist and considering  bariatric surgery.   Otherwise, considering the low pretest probability of coronary disease and atypical pain will not recommend further cardiac testing. ? ?PLAN:   ? ?In order of problems listed above: ? ?Non cardiac chest pain, no further recommendations ? ?   ? ?   ? ? ?Medication Adjustments/Labs and Tests Ordered: ?Current medicines are reviewed at length with the patient today.  Concerns regarding medicines are outlined above.  ?Orders Placed This Encounter  ?Procedures  ? EKG 12-Lead  ? ?No orders of the defined types were placed in this encounter. ? ? ?Patient Instructions  ?Medication Instructions:  ?Your physician recommends that you continue on your current medications as directed. Please refer to the Current Medication list given to you today. ? ?Follow-Up: ?At [facility], you and your health needs are our priority.  As part of our continuing mission to provide you with exceptional heart care, we have created  designated Provider Care Teams.  These Care Teams include your primary Cardiologist (physician) and Advanced Practice Providers (APPs -  Physician Assistants and Nurse Practitioners) who all work together to provide you with the care you need, when you need it. ? ?We recommend signing up for the patient portal called "MyChart".  Sign up information is provided on this After Visit Summary.  MyChart is used to connect with patients for Virtual Visits (Telemedicine).  Patients are able to view lab/test results, encounter notes, upcoming appointments, etc.  Non-urgent messages can be sent to your provider as well.   ?To learn more  about what you can do with MyChart, go to [email or web address].   ? ?Your next appointment:   ?AS NEEDED with Dr. Wyline MoodBranch  ? ? ?Important Information About Sugar ? ? ? ? ? ?  ? ?Signed, ?Maisie FusBranch, Salinda Snedeker E, MD  ?09/04/2021 12:07 PM    ?[facility] ?

## 2021-09-05 ENCOUNTER — Encounter: Payer: Self-pay | Admitting: Speech Pathology

## 2021-09-05 ENCOUNTER — Ambulatory Visit: Payer: Medicare Other | Admitting: Speech Pathology

## 2021-09-05 DIAGNOSIS — R41841 Cognitive communication deficit: Secondary | ICD-10-CM | POA: Diagnosis not present

## 2021-09-05 NOTE — Therapy (Signed)
?OUTPATIENT SPEECH LANGUAGE PATHOLOGY TREATMENT NOTE ? ? ?Emily Ochoa Name: Emily Ochoa ?MRN: [id number] ?DOB:Oct 22, 1980, 41 y.o., female ?Today's Date: 09/05/2021 ? ?PCP: N/A ?REFERRING PROVIDER: Drema Dallas, DO  ? ?END OF SESSION:  ? End of Session - 09/05/21 1323   ? ? Visit Number 4   ? Number of Visits 17   ? Date for SLP Re-Evaluation 10/19/21   ? Authorization Type [facility] Medicare/Medicaid [place] Access   ? SLP Start Time 1320   ? SLP Stop Time  1400   ? SLP Time Calculation (min) 40 min   ? Activity Tolerance Emily Ochoa tolerated treatment well   ? ?  ?  ? ?  ? ? ?Past Medical History:  ?Diagnosis Date  ? Depression   ? Fibromyalgia   ? Hypertension   ? URI (upper respiratory infection)   ? ?Past Surgical History:  ?Procedure Laterality Date  ? BACK SURGERY    ? bioposy in 2009  ? CHOLECYSTECTOMY    ? ?Emily Ochoa Active Problem List  ? Diagnosis Date Noted  ? GERD (gastroesophageal reflux disease) 11/04/2014  ? Elevated blood sugar 11/03/2014  ? Morbid obesity ([facility]) 11/03/2014  ? Depression 11/03/2014  ? Bulging lumbar disc 11/03/2014  ? ? ?ONSET DATE: Fall 2022; memory problems started in 2026 per chart review ? 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 ?THERAPY DIAG: Cognitive communication deficit ? ?SUBJECTIVE: "My twitching was so bad it affected my fibromyalgia so I couldn't do my HW" ? ?PAIN:  ?Are you having pain? 5/6 legs and right arm, rest improves it ? ? ?OBJECTIVE:  ?TODAY'S TREATMENT:  ? ?09-05-21 - Emily Ochoa reports missing an appointment as she wrote down the wrong time.  She did not open the reminder of her appointment that was texted to her. We generated strategy of always opening and double checking times, as well as calling the day before to confirm date and time. Due to pain, she has not been able to complete HW or reading. Emily Ochoa has carried over strategies of repeating back what she has heard, having information in writing. She is using to do list with some  success, however when she is not feeling well she can't do as much.We generated strategy of setting top 3 priorities for each day, then write down other tasks in order of importance. She continues to hyperfocus on occasional word finding difficulties. Instructed her to limit hyperfocus and stop perseverating thoughts on her errors. In complex alternating attention task over 10 minutes, Emily Ochoa required usual redirection to each of the 3 tasks she was alternating between.  ? ?08-29-21:  Emily Ochoa went to ED yesterday with chest pain. They recommended Emily Ochoa, I suggested she get a referral here so she doesn't have to manage 2 places and appointment times. She verbalized carryover of 4 strategies for attention. Trained in compensations for slow processing of conversations - see Emily Ochoa instructions. She endorses asking her children to text her important information and writing down important information immediately after a conversation or call. Trained in use of compensations for word finding, including use of synonyms and descriptions. Complex naming task alternating attention between naming and conversation over 15 minutes with 1 verbal cue to recall letter she was to use to name items. She returned to task with rare min A. In complex naming task, Emily Ochoa named 20/24 words with extended time and occasional min A.  ? ? ?08-27-21: Emily Ochoa has been consistently completing Constant Therapy exercises targeting cognitive linguistic skills. Reported two episodes  of word finding for less familiar words. In review of attention/memory compensations education initiated last session, good implementation reported. Emily Ochoa purchased agenda and inserted all upcoming appointments with specifics and set two alarms to aid recall. Emily Ochoa has becoming increasingly aware of how external distractions impact overall cognitive functioning, in which Emily Ochoa has now began initiating modifications to reduce distractions (turning down television, working in another room,  focusing on one task at a time). Structured attention tasks completed with focus on implementing targeted strategies (reading instructions twice to aid attention and comprehension, using attention strategies as needed). Emily Ochoa completed initial attention with 100% accuracy with good selective and sustained attention exhibited, even with inclusion of mild background stimuli. Second structured sequencing task required increased processing time and occasional min to mod A to ID errors and attend to all important details for more complex sequencing task.  ? ?08-21-21: Educated Emily Ochoa on evaluation results and clinical observations. Initiated brief education (to be completed next session) of attention and memory strategies with handout provided. Emily Ochoa verbalized understanding of education and agreement with ST intervention to address mild cognitive linguistic deficits.   ?  ?  ?Emily Ochoa EDUCATION: ?Education details: see above  ?Person educated: Emily Ochoa ?Education method: Explanation, Demonstration, and Handouts ?Education comprehension: verbalized understanding, returned demonstration, and needs further education ?  ?  ?  ?  ?GOALS: ?Goals reviewed with Emily Ochoa? Yes ?  ?SHORT TERM GOALS: Target date: 09/21/2021 ?  ?Emily Ochoa will implement 2 attention/memory compensations to aid daily functioning given rare min A over 2 sessions  ?Baseline: 08-27-21 (agenda, alarms, attention strats) ?Goal status: ongoing ?  ?2.  Emily Ochoa complete alternating attention tasks given 3 or less redirections and rare min A over 2 sessions  ?Baseline:  ?Goal status: ongoing ?  ?3.  Emily Ochoa will implement strateiges to aid processing and comprehension in conversation and while reading short passages given occasional min over 2 sessions           ?Baseline: 08-27-21 ?Goal status: ongoing ?  ?  ?  ?LONG TERM GOALS: Target date: 10/19/2021 ?  ?Emily Ochoa will utilize 4 attention/memory compensations and strategies to aid recall and attention without cues from family > 1 week ?Baseline:   ?Goal status: ongoing ?  ?2.  Emily Ochoa will use strategies to aid processing in conversation and during reading tasks given rare min A over 2 sessions  ?Baseline:  ?Goal status: ongoing ?  ?3.  Emily Ochoa will report improved cognitive communication functioning via PROM by 5 points at last ST session ?Baseline: CF=102 ?Goal status: ongoing ?  ?  ?ASSESSMENT: ?  ?CLINICAL IMPRESSION: ?Emily Ochoa is a 42 y.o. female who was seen today for mildcognitive disorder. Emily Ochoa evaluated by this SLP in November 2022 but unable to attend tx due to work scheduling. Emily Ochoa is currently no longer working due to cognitive changes, including short term recall.  Emily Ochoa current experiencing difficulty with recall in conversation and comprehension while reading (reads 2-3x to understand). Brain fog and concentration reportedly improved. Use of compensations (writing down information and alarms) independently implemented for functional household management (bills and meds) with good accuracy reported. Some word finding (corrected with extra time) and rare "slur" reported but improved since last evaluation. Initiated education and training of memory/attention compensations to aid daily functioning, with good carryover exhibited thus far. Emily Ochoa would benefit from skilled ST intervention to address mild high level cognitive deficits related to attention and memory to maximize current functioning and optimize return to work.  ?  ?OBJECTIVE IMPAIRMENTS include  attention, memory, and receptive language. These impairments are limiting Emily Ochoa from return to work and household responsibilities. ?Factors affecting potential to achieve goals and functional outcome are previous level of function. Emily Ochoa will benefit from skilled SLP services to address above impairments and improve overall function. ?  ?REHAB POTENTIAL: Good ?  ?PLAN: ?SLP FREQUENCY: 2x/week ?  ?SLP DURATION: 8 weeks ?  ?PLANNED INTERVENTIONS: Cueing hierachy, Cognitive reorganization, Internal/external aids,  Functional tasks, SLP instruction and feedback, Compensatory strategies, and Emily Ochoa/family education ? ? ?Kameryn Davern, Radene JourneyLaura Ann, CCC-SLP ?09/05/2021, 2:48 PM ? ? ?

## 2021-09-05 NOTE — Patient Instructions (Addendum)
? ?  Open the reminders, call the day before to confirm the appointment date and time ? ?This week, focus on setting top 3 priorities on to do list for each day or for the week, then add other less important tasks as you are able ? ?Try not to hyperfocus on mistakes, say "grace" and let it go with deep breaths ? ?Get back into routine, start reading short passages for 15 to 20 minutes in quiet environment  ? ? ?

## 2021-09-06 LAB — SJOGREN'S SYNDROME ANTIBODS(SSA + SSB)
SSA (Ro) (ENA) Antibody, IgG: <1 AI
SSB (La) (ENA) Antibody, IgG: <1 AI

## 2021-09-06 LAB — ANCA SCREEN W REFLEX TITER: ANCA SCREEN: NEGATIVE

## 2021-09-07 ENCOUNTER — Ambulatory Visit: Payer: Medicare Other

## 2021-09-10 ENCOUNTER — Ambulatory Visit: Payer: Medicare Other | Admitting: Speech Pathology

## 2021-09-10 ENCOUNTER — Encounter: Payer: Self-pay | Admitting: Speech Pathology

## 2021-09-10 DIAGNOSIS — R41841 Cognitive communication deficit: Secondary | ICD-10-CM

## 2021-09-10 NOTE — Therapy (Signed)
?OUTPATIENT SPEECH LANGUAGE PATHOLOGY TREATMENT NOTE ? ? ?Patient Name: Emily Ochoa ?MRN: [id number] ?DOB:1980/06/27, 41 y.o., female ?Today's Date: 09/10/2021 ? ?PCP: N/A ?REFERRING PROVIDER: Pieter Partridge, DO  ? ?END OF SESSION:  ? End of Session - 09/10/21 0939   ? ? Visit Number 5   ? Number of Visits 17   ? Date for SLP Re-Evaluation 10/19/21   ? Authorization Type [facility] Access   ? SLP Start Time [phone]   ? SLP Stop Time  1015   ? SLP Time Calculation (min) 41 min   ? Activity Tolerance Patient tolerated treatment well   ? ?  ?  ? ?  ? ? ?Past Medical History:  ?Diagnosis Date  ? Depression   ? Fibromyalgia   ? Hypertension   ? URI (upper respiratory infection)   ? ?Past Surgical History:  ?Procedure Laterality Date  ? BACK SURGERY    ? bioposy in 2009  ? CHOLECYSTECTOMY    ? ?Patient Active Problem List  ? Diagnosis Date Noted  ? GERD (gastroesophageal reflux disease) 11/04/2014  ? Elevated blood sugar 11/03/2014  ? Morbid obesity ([place]) 11/03/2014  ? Depression 11/03/2014  ? Bulging lumbar disc 11/03/2014  ? ? ?ONSET DATE: Fall 2022; memory problems started in 2026 per chart review ? 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 ?THERAPY DIAG: Cognitive communication deficit ? ?SUBJECTIVE: "I set up mother's day and coordinated everyone. It went well" ? ?PAIN:  ?Are you having pain? 3 legs and right arm, rest improves it ? ? ?OBJECTIVE:  ?TODAY'S TREATMENT:  ? ?09-10-21 - [name] has carried over setting up her priorities over this past week and keeping this list near her calendar to schedule. Today her priorities are cable and phone. She has been keeping a set time of paying bills at the beginning of each month as she is able. She has missed no appointments due to forgetting them. She has consistently carried over strategies for processing conversation by repeating back what she has heard and getting information in writing and  double checking appointment  times. She also double checked the time and date for her daughter's band recital in her calendar when her daughter mentioned it. Targeted alternating attention organizing detailed information (hours worked from [employer]) and alternating between calculator and chart, as well as intermittent conversation with rare min A for redirection (1x over 15 minutes). She self corrected errors with rare min A to double check. She is carrying over double checking amounts when paying bills and entering her account numbers. She noted her phone had a call and email while she was using the calculator and she noted distraction but kept on task. She is turning the TV down at home when she needs to concentrate. Emily Ochoa summarized recommendations of the amount of melatonin in the article and summarized Emily Ochoa crime report that she follows as well. Emily Ochoa reports improved comprehension during reading.  ? ?09-05-21 - Emily Ochoa reports missing an appointment as she wrote down the wrong time.  She did not open the reminder of her appointment that was texted to her. We generated strategy of always opening and double checking times, as well as calling the day before to confirm date and time. Due to pain, she has not been able to complete HW or reading. Emily Ochoa has carried over strategies of repeating back what she has heard, having information in writing. She is using to do list with some success, however when she is  not feeling well she can't do as much.We generated strategy of setting top 3 priorities for each day, then write down other tasks in order of importance. She continues to hyperfocus on occasional word finding difficulties. Instructed her to limit hyperfocus and stop perseverating thoughts on her errors. In complex alternating attention task over 10 minutes, Emily Ochoa required usual redirection to each of the 3 tasks she was alternating between.  ? ?08-29-21:  Emily Ochoa went to ED yesterday with chest pain. They recommended PT,  I suggested she get a referral here so she doesn't have to manage 2 places and appointment times. She verbalized carryover of 4 strategies for attention. Trained in compensations for slow processing of conversations - see pt instructions. She endorses asking her children to text her important information and writing down important information immediately after a conversation or call. Trained in use of compensations for word finding, including use of synonyms and descriptions. Complex naming task alternating attention between naming and conversation over 15 minutes with 1 verbal cue to recall letter she was to use to name items. She returned to task with rare min A. In complex naming task, Emily Ochoa named 20/24 words with extended time and occasional min A.  ? ? ?08-27-21: Pt has been consistently completing Constant Therapy exercises targeting cognitive linguistic skills. Reported two episodes of word finding for less familiar words. In review of attention/memory compensations education initiated last session, good implementation reported. Pt purchased agenda and inserted all upcoming appointments with specifics and set two alarms to aid recall. Pt has becoming increasingly aware of how external distractions impact overall cognitive functioning, in which pt has now began initiating modifications to reduce distractions (turning down television, working in another room, focusing on one task at a time). Structured attention tasks completed with focus on implementing targeted strategies (reading instructions twice to aid attention and comprehension, using attention strategies as needed). Pt completed initial attention with 100% accuracy with good selective and sustained attention exhibited, even with inclusion of mild background stimuli. Second structured sequencing task required increased processing time and occasional min to mod A to ID errors and attend to all important details for more complex sequencing task.  ? ?   ?PATIENT EDUCATION: ?Education details: see above  ?Person educated: Patient ?Education method: Explanation, Demonstration, and Handouts ?Education comprehension: verbalized understanding, returned demonstration, and needs further education ?  ?  ?  ?  ?GOALS: ?Goals reviewed with patient? Yes ?  ?SHORT TERM GOALS: Target date: 09/21/2021 ?  ?Pt will implement 2 attention/memory compensations to aid daily functioning given rare min A over 2 sessions  ?Baseline: 08-27-21 (agenda, alarms, attention strats) ?Goal status: GOAL MET ?  ?2.  Pt complete alternating attention tasks given 3 or less redirections and rare min A over 2 sessions  ?Baseline: 09-10-21;  ?Goal status: ongoing ?  ?3.  Pt will implement strateiges to aid processing and comprehension in conversation and while reading short passages given occasional min over 2 sessions           ?Baseline: 08-27-21 ?Goal status: GOAL MET ?  ?  ?  ?LONG TERM GOALS: Target date: 10/19/2021 ?  ?Pt will utilize 4 attention/memory compensations and strategies to aid recall and attention without cues from family > 1 week ?Baseline:  ?Goal status: ongoing ?  ?2.  Pt will use strategies to aid processing in conversation and during reading tasks given rare min A over 2 sessions  ?Baseline:  ?Goal status: ongoing ?  ?3.  Pt  will report improved cognitive communication functioning via PROM by 5 points at last ST session ?Baseline: CF=102 ?Goal status: ongoing ?  ?  ?ASSESSMENT: ?  ?CLINICAL IMPRESSION: ?Patient is a 41 y.o. female who was seen today for mildcognitive disorder. Pt evaluated by this SLP in November 2022 but unable to attend tx due to work scheduling. Pt is currently no longer working due to cognitive changes, including short term recall.  Pt current experiencing difficulty with recall in conversation and comprehension while reading (reads 2-3x to understand). Brain fog and concentration reportedly improved. Use of compensations (writing down information and alarms)  independently implemented for functional household management (bills and meds) with good accuracy reported. Some word finding (corrected with extra time) and rare "slur" reported but improved since last evalu

## 2021-09-12 ENCOUNTER — Ambulatory Visit: Payer: Medicare Other | Admitting: Speech Pathology

## 2021-09-18 ENCOUNTER — Ambulatory Visit: Payer: Medicare Other

## 2021-09-18 DIAGNOSIS — R41841 Cognitive communication deficit: Secondary | ICD-10-CM | POA: Diagnosis not present

## 2021-09-18 NOTE — Therapy (Signed)
OUTPATIENT SPEECH LANGUAGE PATHOLOGY TREATMENT NOTE   Patient Name: Emily Ochoa MRN: [id number] DOB:Aug 30, 1980, 41 y.o., female Today's Date: 09/18/2021  PCP: N/A REFERRING PROVIDER: Pieter Partridge, DO   END OF SESSION:   End of Session - 09/18/21 1441     Visit Number 6    Number of Visits 17    Date for SLP Re-Evaluation 10/19/21    Authorization Type [employer]    SLP Start Time 1445    SLP Stop Time  1527    SLP Time Calculation (min) 42 min    Activity Tolerance Patient tolerated treatment well             Past Medical History:  Diagnosis Date   Depression    Fibromyalgia    Hypertension    URI (upper respiratory infection)    Past Surgical History:  Procedure Laterality Date   BACK SURGERY     bioposy in 2009   CHOLECYSTECTOMY     Patient Active Problem List   Diagnosis Date Noted   GERD (gastroesophageal reflux disease) 11/04/2014   Elevated blood sugar 11/03/2014   Morbid obesity ([place]) 11/03/2014   Depression 11/03/2014   Bulging lumbar disc 11/03/2014   Rationale for Evaluation and Treatment Rehabilitation  ONSET DATE: Fall 2022; memory problems started in 2026 per chart review  REFERRING DIAG: 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 No  OBJECTIVE:  TODAY'S TREATMENT:  09-18-21: Pt reported frustration related to slurred words x1-2 in recent conversation, which was correlated to increased excitement. Pt aware of increased hyperfocus/sensitivity for small changes. SLP affirmed these changes are not overtly significant and pt would benefit from slowing rate of speech and allowing grace for small errors as needed. Otherwise, pt reports good carryover of recommendations and techniques, including use of to-do lists and reminders, with improving awareness and recognition indicated.  SLP conducted alternating attention task with intermittent conversation inserted into structured cognitive task with no overt deviations in attention exhibited. Functional math task completed with 88% accuracy without double check. SLP cued double check, in which pt identified and corrected errors without A. If success continues, pt could likely d/c from ST intervention in next several visits given good carryover and implementation of SLP recommendations.   09-10-21 - Emily Ochoa has carried over setting up her priorities over this past week and keeping this list near her calendar to schedule. Today her priorities are cable and phone. She has been keeping a set time of paying bills at the beginning of each month as she is able. She has missed no appointments due to forgetting them. She has consistently carried over strategies for processing conversation by repeating back what she has heard and getting information in writing and  double checking appointment times. She also double checked the time and date for her daughter's band recital in her calendar when her daughter mentioned it. Targeted alternating attention organizing detailed information (hours worked from [employer]) and alternating between calculator and chart, as well as intermittent conversation with rare min A for redirection (1x over 15 minutes). She self corrected errors with rare min A to double check. She is carrying over double checking amounts when paying bills and entering her account numbers. She noted her phone had a call and email while she was using the calculator and she noted distraction but kept on task. She is turning  the TV down at home when she needs to concentrate. Emily Ochoa summarized recommendations of the amount of melatonin in the article and summarized [place] crime report that she follows as well. Emily Ochoa reports improved comprehension during reading.   09-05-21 - Emily Ochoa reports missing an appointment as she wrote down the wrong  time.  She did not open the reminder of her appointment that was texted to her. We generated strategy of always opening and double checking times, as well as calling the day before to confirm date and time. Due to pain, she has not been able to complete HW or reading. Emily Ochoa has carried over strategies of repeating back what she has heard, having information in writing. She is using to do list with some success, however when she is not feeling well she can't do as much.We generated strategy of setting top 3 priorities for each day, then write down other tasks in order of importance. She continues to hyperfocus on occasional word finding difficulties. Instructed her to limit hyperfocus and stop perseverating thoughts on her errors. In complex alternating attention task over 10 minutes, Emily Ochoa required usual redirection to each of the 3 tasks she was alternating between.   08-29-21:  Emily Ochoa went to ED yesterday with chest pain. They recommended PT, I suggested she get a referral here so she doesn't have to manage 2 places and appointment times. She verbalized carryover of 4 strategies for attention. Trained in compensations for slow processing of conversations - see pt instructions. She endorses asking her children to text her important information and writing down important information immediately after a conversation or call. Trained in use of compensations for word finding, including use of synonyms and descriptions. Complex naming task alternating attention between naming and conversation over 15 minutes with 1 verbal cue to recall letter she was to use to name items. She returned to task with rare min A. In complex naming task, Emily Ochoa named 20/24 words with extended time and occasional min A.   08-27-21: Pt has been consistently completing [facility] exercises targeting cognitive linguistic skills. Reported two episodes of word finding for less familiar words. In review of attention/memory  compensations education initiated last session, good implementation reported. Pt purchased agenda and inserted all upcoming appointments with specifics and set two alarms to aid recall. Pt has becoming increasingly aware of how external distractions impact overall cognitive functioning, in which pt has now began initiating modifications to reduce distractions (turning down television, working in another room, focusing on one task at a time). Structured attention tasks completed with focus on implementing targeted strategies (reading instructions twice to aid attention and comprehension, using attention strategies as needed). Pt completed initial attention with 100% accuracy with good selective and sustained attention exhibited, even with inclusion of mild background stimuli. Second structured sequencing task required increased processing time and occasional min to mod A to ID errors and attend to all important details for more complex sequencing task.     PATIENT EDUCATION: Education details: see above  Person educated: Patient Education method: Explanation, Demonstration, and Handouts Education comprehension: verbalized understanding, returned demonstration, and needs further education         GOALS: Goals reviewed with patient? Yes   SHORT TERM GOALS: Target date: 09/21/2021   Pt will implement 2 attention/memory compensations to aid daily functioning given rare min A over 2 sessions  Baseline: 08-27-21 (agenda, alarms, attention strats) Goal status: GOAL MET   2.  Pt complete alternating attention tasks given 3 or less  redirections and rare min A over 2 sessions  Baseline: 09-10-21; 09-18-21 Goal status: GOAL MET   3.  Pt will implement strateiges to aid processing and comprehension in conversation and while reading short passages given occasional min over 2 sessions           Baseline: 08-27-21 Goal status: GOAL MET       LONG TERM GOALS: Target date: 10/19/2021   Pt will utilize 4  attention/memory compensations and strategies to aid recall and attention without cues from family > 1 week Baseline:  Goal status: ongoing   2.  Pt will use strategies to aid processing in conversation and during reading tasks given rare min A over 2 sessions  Baseline: 09-18-21 Goal status: ongoing   3.  Pt will report improved cognitive communication functioning via PROM by 5 points at last ST session Baseline: CF=102 Goal status: ongoing     ASSESSMENT:   CLINICAL IMPRESSION: Patient is a 41 y.o. female who was seen today for mildcognitive disorder. Conducted ongoing education and training of memory/attention compensations to aid daily functioning, with great carryover exhibited thus far. Pt would benefit from skilled ST intervention to address mild high level cognitive deficits related to attention and memory to maximize current functioning and optimize return to work.    OBJECTIVE IMPAIRMENTS include attention, memory, and receptive language. These impairments are limiting patient from return to work and household responsibilities. Factors affecting potential to achieve goals and functional outcome are previous level of function. Patient will benefit from skilled SLP services to address above impairments and improve overall function.   REHAB POTENTIAL: Good   PLAN: SLP FREQUENCY: 2x/week   SLP DURATION: 8 weeks   PLANNED INTERVENTIONS: Cueing hierachy, Cognitive reorganization, Internal/external aids, Functional tasks, SLP instruction and feedback, Compensatory strategies, and Patient/family education   Marzetta Board, CCC-SLP 09/18/2021, 3:28 PM

## 2021-09-19 ENCOUNTER — Encounter: Payer: Self-pay | Admitting: Speech Pathology

## 2021-09-19 ENCOUNTER — Ambulatory Visit: Payer: Medicare Other | Admitting: Speech Pathology

## 2021-09-19 DIAGNOSIS — R41841 Cognitive communication deficit: Secondary | ICD-10-CM

## 2021-09-19 NOTE — Therapy (Signed)
OUTPATIENT SPEECH LANGUAGE PATHOLOGY TREATMENT NOTE   Patient Name: Emily Ochoa MRN: [id number] DOB:1980/07/21, 41 y.o., female Today's Date: 09/19/2021  PCP: N/A REFERRING PROVIDER: Pieter Partridge, DO   END OF SESSION:   End of Session - 09/19/21 0922     Visit Number 7    Number of Visits 17    Date for SLP Re-Evaluation 10/19/21    Authorization Type [employer]    SLP Start Time 0930    SLP Stop Time  1010    SLP Time Calculation (min) 40 min    Activity Tolerance Patient tolerated treatment well             Past Medical History:  Diagnosis Date   Depression    Fibromyalgia    Hypertension    URI (upper respiratory infection)    Past Surgical History:  Procedure Laterality Date   BACK SURGERY     bioposy in 2009   CHOLECYSTECTOMY     Patient Active Problem List   Diagnosis Date Noted   GERD (gastroesophageal reflux disease) 11/04/2014   Elevated blood sugar 11/03/2014   Morbid obesity ([place]) 11/03/2014   Depression 11/03/2014   Bulging lumbar disc 11/03/2014   Rationale for Evaluation and Treatment Rehabilitation  ONSET DATE: Fall 2022; memory problems started in 2026 per chart review  REFERRING DIAG: 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 No  OBJECTIVE:  TODAY'S TREATMENT:   09-19-21: Emily Ochoa called [facility] and got a discount which demonstrates improved executive function. She reports improved reading comprehension and ability to focus on reading and lines. She recalled on her own to get the mail, as she usually uses a reminder. She is successful using her phone to enter meetings and she was able to adapt and add a meeting time that had changed last minute in her phone. She has implemented system of paying bills on time by paying them when they come up in her email and writing down on  a list that the bill has been paid. She is also keeping track by paying by [place] and keeping the receipts organized. 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 No dysfluency or slur noted this session - speech WNL. She continues to carryover strategies for attention of reducing background noise, limiting multi tasking, closing her door, reminding her kids not to interrupt her when she is on a business call and muting the TV.    09-18-21: Pt reported frustration related to slurred words x1-2 in recent conversation, which was correlated to increased excitement. Pt aware of increased hyperfocus/sensitivity for small changes. SLP affirmed these changes are not overtly significant and pt would benefit from slowing rate of speech and allowing grace for small errors as needed. Otherwise, pt reports good carryover of recommendations and techniques, including use of to-do lists and reminders, with improving awareness and recognition indicated. SLP conducted alternating attention task with intermittent conversation inserted into structured cognitive task with no overt deviations in attention exhibited. Functional math task completed with 88% accuracy without double check. SLP cued double check, in which pt identified and corrected errors without A. If success continues, pt could likely d/c from ST intervention in next several visits given good carryover and implementation of SLP recommendations.   09-10-21 - Emily Ochoa has carried over setting up  her priorities over this past week and keeping this list near her calendar to schedule. Today her priorities are cable and phone. She has been keeping a set time of paying bills at the beginning of each month as she is able. She has missed no appointments due to forgetting them. She has consistently carried over strategies for processing conversation by repeating back what she has heard and getting  information in writing and  double checking appointment times. She also double checked the time and date for her daughter's band recital in her calendar when her daughter mentioned it. Targeted alternating attention organizing detailed information (hours worked from [employer]) and alternating between calculator and chart, as well as intermittent conversation with rare min A for redirection (1x over 15 minutes). She self corrected errors with rare min A to double check. She is carrying over double checking amounts when paying bills and entering her account numbers. She noted her phone had a call and email while she was using the calculator and she noted distraction but kept on task. She is turning the TV down at home when she needs to concentrate. Emily Ochoa summarized recommendations of the amount of melatonin in the article and summarized Emily Ochoa crime report that she follows as well. Emily Ochoa reports improved comprehension during reading.   09-05-21 - Emily Ochoa reports missing an appointment as she wrote down the wrong time.  She did not open the reminder of her appointment that was texted to her. We generated strategy of always opening and double checking times, as well as calling the day before to confirm date and time. Due to pain, she has not been able to complete HW or reading. Emily Ochoa has carried over strategies of repeating back what she has heard, having information in writing. She is using to do list with some success, however when she is not feeling well she can't do as much.We generated strategy of setting top 3 priorities for each day, then write down other tasks in order of importance. She continues to hyperfocus on occasional word finding difficulties. Instructed her to limit hyperfocus and stop perseverating thoughts on her errors. In complex alternating attention task over 10 minutes, Emily Ochoa required usual redirection to each of the 3 tasks she was alternating between.   08-29-21:  Emily Ochoa  went to ED yesterday with chest pain. They recommended PT, I suggested she get a referral here so she doesn't have to manage 2 places and appointment times. She verbalized carryover of 4 strategies for attention. Trained in compensations for slow processing of conversations - see pt instructions. She endorses asking her children to text her important information and writing down important information immediately after a conversation or call. Trained in use of compensations for word finding, including use of synonyms and descriptions. Complex naming task alternating attention between naming and conversation over 15 minutes with 1 verbal cue to recall letter she was to use to name items. She returned to task with rare min A. In complex naming task, Emily Ochoa named 20/24 words with extended time and occasional min A.   PATIENT EDUCATION: Education details: see above  Person educated: Patient Education method: Explanation, Demonstration, and Handouts Education comprehension: verbalized understanding, returned demonstration, and needs further education         GOALS: Goals reviewed with patient? Yes   SHORT TERM GOALS: Target date: 09/21/2021   Pt will implement 2 attention/memory compensations to aid daily functioning given rare min A over 2 sessions  Baseline: 08-27-21 (agenda, alarms, attention strats)  Goal status: GOAL MET   2.  Pt complete alternating attention tasks given 3 or less redirections and rare min A over 2 sessions  Baseline: 09-10-21; 09-18-21 Goal status: GOAL MET   3.  Pt will implement strateiges to aid processing and comprehension in conversation and while reading short passages given occasional min over 2 sessions           Baseline: 08-27-21 Goal status: GOAL MET       LONG TERM GOALS: Target date: 10/19/2021   Pt will utilize 4 attention/memory compensations and strategies to aid recall and attention without cues from family > 1 week Baseline: 09/19/21 Goal status:  ongoing   2.  Pt will use strategies to aid processing in conversation and during reading tasks given rare min A over 2 sessions  Baseline: 09-18-21; 09-19-21 Goal status: GOAL MET   3.  Pt will report improved cognitive communication functioning via PROM by 5 points at last ST session Baseline: CF=102 Goal status: ongoing     ASSESSMENT:   CLINICAL IMPRESSION: Patient is a 41 y.o. female who was seen today for mildcognitive disorder. Conducted ongoing education and training of memory/attention compensations to aid daily functioning, with great carryover exhibited thus far. Emily Ochoa continues to make excellent progress carrying over strategies for attention, memory and processing and reporting success in bill management, schedule management and reading comprehension.  Pt would benefit from skilled ST intervention to address mild high level cognitive deficits related to attention and memory to maximize current functioning and optimize return to work. Due to good progress, will decrease to 1x a week for 2-3 weeks. Expect d/c next 2-3 visits.    OBJECTIVE IMPAIRMENTS include attention, memory, and receptive language. These impairments are limiting patient from return to work and household responsibilities. Factors affecting potential to achieve goals and functional outcome are previous level of function. Patient will benefit from skilled SLP services to address above impairments and improve overall function.   REHAB POTENTIAL: Good   PLAN: SLP FREQUENCY: 1x/week (reduced from 2x a week on 09/19/21)   SLP DURATION: 8 weeks   PLANNED INTERVENTIONS: Cueing hierachy, Cognitive reorganization, Internal/external aids, Functional tasks, SLP instruction and feedback, Compensatory strategies, and Patient/family education   Alice Rieger Annye Rusk, CCC-SLP 09/19/2021, 10:08 AM

## 2021-09-25 ENCOUNTER — Ambulatory Visit: Payer: Medicare Other

## 2021-09-25 DIAGNOSIS — R41841 Cognitive communication deficit: Secondary | ICD-10-CM

## 2021-09-25 NOTE — Therapy (Signed)
OUTPATIENT SPEECH LANGUAGE PATHOLOGY TREATMENT NOTE   Patient Name: Emily Ochoa MRN: [id number] DOB:1981/01/31, 41 y.o., female Today's Date: 09/25/2021  PCP: N/A REFERRING PROVIDER: Pieter Partridge, DO   END OF SESSION:   End of Session - 09/25/21 1008     Visit Number 8    Number of Visits 17    Date for SLP Re-Evaluation 10/19/21    Authorization Type [place] [facility]    SLP Start Time 1010    SLP Stop Time  1055    SLP Time Calculation (min) 45 min    Activity Tolerance Patient tolerated treatment well              Past Medical History:  Diagnosis Date   Depression    Fibromyalgia    Hypertension    URI (upper respiratory infection)    Past Surgical History:  Procedure Laterality Date   BACK SURGERY     bioposy in 2009   CHOLECYSTECTOMY     Patient Active Problem List   Diagnosis Date Noted   GERD (gastroesophageal reflux disease) 11/04/2014   Elevated blood sugar 11/03/2014   Morbid obesity ([place]) 11/03/2014   Depression 11/03/2014   Bulging lumbar disc 11/03/2014   Rationale for Evaluation and Treatment Rehabilitation  ONSET DATE: Fall 2022; memory problems started in 2026 per chart review  REFERRING DIAG: G31.84 (ICD-10-CM) - Mild neurocognitive disorder   THERAPY DIAG: Cognitive communication deficit  SUBJECTIVE: "Everything has been going okay"  PAIN:  Are you having pain? No  OBJECTIVE:  TODAY'S TREATMENT:  09-25-21: Aware of increased errors when increased distractions present, in which pt able to appropriately identify modifications. Cognitive fatigue reported, in which pt implemented breaks with success when fatigued. Generated additional memory strategies to aid recall with handout provided. Targeted verbal expression/recall task with use of targeted compensations, in which pt benefited from rare cues and use of writing down to aid accuracy. Continues with WNL verbal expression. Pt is agreeable to ST discharge next  session due to good progress.   09-19-21: Gayatri called [facility] and got a discount which demonstrates improved executive function. She reports improved reading comprehension and ability to focus on reading and lines. She recalled on her own to get the mail, as she usually uses a reminder. She is successful using her phone to enter meetings and she was able to adapt and add a meeting time that had changed last minute in her phone. She has implemented system of paying bills on time by paying them when they come up in her email and writing down on a list that the bill has been paid. She is also keeping track by paying by [place] and keeping the receipts organized. 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 No dysfluency or slur noted this session - speech WNL. She continues to carryover strategies for attention of reducing background noise, limiting multi tasking, closing her door, reminding her kids not to interrupt her when she is on a business call and muting the TV.   09-18-21: Pt reported frustration related to slurred words x1-2 in recent conversation, which was correlated to increased excitement. Pt aware of increased hyperfocus/sensitivity for small changes. SLP affirmed these changes are not overtly significant and pt would benefit from slowing rate of speech and allowing grace for small errors as needed. Otherwise, pt reports good carryover of recommendations and techniques, including use of to-do lists and  reminders, with improving awareness and recognition indicated. SLP conducted alternating attention task with intermittent conversation inserted into structured cognitive task with no overt deviations in attention exhibited. Functional math task completed with 88% accuracy without double check. SLP cued double check, in which pt identified and corrected errors without A. If success continues, pt could likely d/c  from ST intervention in next several visits given good carryover and implementation of SLP recommendations.   09-10-21 - Mattie has carried over setting up her priorities over this past week and keeping this list near her calendar to schedule. Today her priorities are cable and phone. She has been keeping a set time of paying bills at the beginning of each month as she is able. She has missed no appointments due to forgetting them. She has consistently carried over strategies for processing conversation by repeating back what she has heard and getting information in writing and  double checking appointment times. She also double checked the time and date for her daughter's band recital in her calendar when her daughter mentioned it. Targeted alternating attention organizing detailed information (hours worked from [employer]) and alternating between calculator and chart, as well as intermittent conversation with rare min A for redirection (1x over 15 minutes). She self corrected errors with rare min A to double check. She is carrying over double checking amounts when paying bills and entering her account numbers. She noted her phone had a call and email while she was using the calculator and she noted distraction but kept on task. She is turning the TV down at home when she needs to concentrate. Enda summarized recommendations of the amount of melatonin in the article and summarized [place] crime report that she follows as well. Kareema reports improved comprehension during reading.   09-05-21 - Angely reports missing an appointment as she wrote down the wrong time.  She did not open the reminder of her appointment that was texted to her. We generated strategy of always opening and double checking times, as well as calling the day before to confirm date and time. Due to pain, she has not been able to complete HW or reading. Amulya has carried over strategies of repeating back what she has heard, having  information in writing. She is using to do list with some success, however when she is not feeling well she can't do as much.We generated strategy of setting top 3 priorities for each day, then write down other tasks in order of importance. She continues to hyperfocus on occasional word finding difficulties. Instructed her to limit hyperfocus and stop perseverating thoughts on her errors. In complex alternating attention task over 10 minutes, Mallary required usual redirection to each of the 3 tasks she was alternating between.   PATIENT EDUCATION: Education details: see above  Person educated: Patient Education method: Explanation, Demonstration, and Handouts Education comprehension: verbalized understanding, returned demonstration, and needs further education      GOALS: Goals reviewed with patient? Yes   SHORT TERM GOALS: Target date: 09/21/2021   Pt will implement 2 attention/memory compensations to aid daily functioning given rare min A over 2 sessions  Baseline: 08-27-21 (agenda, alarms, attention strats) Goal status: GOAL MET   2.  Pt complete alternating attention tasks given 3 or less redirections and rare min A over 2 sessions  Baseline: 09-10-21; 09-18-21 Goal status: GOAL MET   3.  Pt will implement strateiges to aid processing and comprehension in conversation and while reading short passages given occasional min over 2  sessions           Baseline: 08-27-21 Goal status: GOAL MET       LONG TERM GOALS: Target date: 10/19/2021   Pt will utilize 4 attention/memory compensations and strategies to aid recall and attention without cues from family > 1 week Baseline: 09/19/21; 09-25-21 Goal status: GOAL MET   2.  Pt will use strategies to aid processing in conversation and during reading tasks given rare min A over 2 sessions  Baseline: 09-18-21; 09-19-21 Goal status: GOAL MET   3.  Pt will report improved cognitive communication functioning via PROM by 5 points at last ST  session Baseline: CF=102 Goal status: ongoing     ASSESSMENT:   CLINICAL IMPRESSION: Patient is a 41 y.o. female who was seen today for mildcognitive disorder. Conducted ongoing education and training of memory/attention compensations to aid daily functioning, with great carryover exhibited thus far. Shelton Silvas continues to make excellent progress carrying over strategies for attention, memory and processing and reporting success in bill management, schedule management and reading comprehension.  Pt would benefit from skilled ST intervention to address mild high level cognitive deficits related to attention and memory to maximize current functioning and optimize return to work. Due to good progress, anticipate d/c next session.    OBJECTIVE IMPAIRMENTS include attention, memory, and receptive language. These impairments are limiting patient from return to work and household responsibilities. Factors affecting potential to achieve goals and functional outcome are previous level of function. Patient will benefit from skilled SLP services to address above impairments and improve overall function.   REHAB POTENTIAL: Good   PLAN: SLP FREQUENCY: 1x/week (reduced from 2x a week on 09/19/21)   SLP DURATION: 8 weeks   PLANNED INTERVENTIONS: Cueing hierachy, Cognitive reorganization, Internal/external aids, Functional tasks, SLP instruction and feedback, Compensatory strategies, and Patient/family education   Marzetta Board, CCC-SLP 09/25/2021, 10:08 AM

## 2021-09-25 NOTE — Patient Instructions (Signed)
Other memory strategies to try: Visual picture - picture it in your head  Chunking - break down information into smaller groups/categories  Repetition - over and over again  Associations - think of ways to connect information together to help your recall

## 2021-09-27 ENCOUNTER — Other Ambulatory Visit: Payer: Self-pay | Admitting: Neurology

## 2021-10-01 ENCOUNTER — Ambulatory Visit: Payer: Medicare Other

## 2021-10-02 NOTE — Progress Notes (Unsigned)
NEUROLOGY FOLLOW UP OFFICE NOTE  Emily Ochoa [id number]  Assessment/Plan:   1  Both daily headaches and episodes of right sided facial numbness and eye pain may be migraine without aura and migraine with aura respectively. 2  Mild neurocognitive disorder - etiology unclear whether it is due to external factors or primary neurologic etiology. 3  Abnormal white matter on brain MRI - nonspecific but pattern seems more consistent with chronic small vessel disease.   4     For management of headaches, start nortriptyline 25mg  at bedtime.  We can increase to 50mg  at bedtime in 6 weeks if needed.  Monitor blood pressure. Repeat MRI of brain with as well as without contrast She takes B12 supplement but will check level to ensure adequate absorption Repeat neuropsychological evaluation in August Limit use of pain relievers to no more than 2 days out of week to prevent risk of rebound or medication-overuse headache. Keep headache diary Follow up in 4 months.     Subjective:  Emily Ochoa is a 41 year old female with HTN, fibromyalgia and migraines who follows up for migraines and cognitive deficits  UPDATE: MRI of brain with and without contrast on 08/30/2021 personally reviewed showed unchanged nonspecific chronic subcortical and periventricular white matter disease likely chronic small vessel ischemic changes.  Ordered Sjogren's and ANCA panel which have not been performed.  B12 level from February was 619.  Referred to speech therapy.  ***  Started nortriptyline in February. Intensity:  *** Duration:  *** Frequency:  *** Frequency of abortive medication: *** Current NSAIDS/analgesics:  ibuprofen (rarely) Current triptans:  none Current ergotamine:  none Current anti-emetic:  none Current muscle relaxants:  baclofen 10mg  TID PRN (leg cramps), tizanidine 4mg  BID PRN Current Antihypertensive medications:  propranolol ER 80mg  daily (higher doses effective but caused side effects -  nausea) Current Antidepressant medications:  none Current Anticonvulsant medications:  topiramate XR 50mg  daily Current anti-CGRP:  none Current Vitamins/Herbal/Supplements:  magnesium chloride, alpha-Brain, D3, folic acid, fish oil Current Antihistamines/Decongestants:  loratidine Other therapy:  none Hormone/birth control:  none Other medications:  none  Caffeine:  *** Alcohol:  *** Smoker:  *** Diet:  *** Exercise:  *** Depression:  ***; Anxiety:  *** Other pain:  *** Sleep hygiene:  ***  HISTORY:  In 2022, she began experiencing new headaches.  No prior history of headaches.  She describes 5-6/10 pounding right parietal headache with mild nausea and osmophobia but no vomiting, photophobia, phonophobia, visual disturbance, dizziness, unilateral numbness or weakness.  They last about 1-2 hours but occur three times daily.  No known triggers.  Laying down to rest helps.  Takes ibuprofen but no more than 2 days out of the week.     She also began having episodes of numbness and tingling.  Initially she experienced numbness and tingling of the entire head and body.  She was previously taking topiramate for fibromyalgia.  She stopped taking it but symptoms persisted.  She no longer has the full body numbness but now has episodes of right facial numbness and tingling associated with 5-6/10 throbbing right retroorbital pain and eye twitch.  Lasts off and on all day.  She has had about 5 or 6 episodes over the past year.     She started having balance issues, particularly when first standing up.  She would feel unsteady.  No dizziness.  This has overall improved.   She reports onset of memory problems in 2016, around the time she was diagnosed  with fibromyalgia.  Specifically, she had brain fog and forgetfullness.  In early 2022, she began experiencing a steady cognitive decline.  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  She also  reports difficulty remembering to take medication.  She started having difficulty completing chores, She sometimes mixes up words.  She has had episodes of slurred speech.     Labs performed in 2022 include sed rate 27 CRP 2, normal SPEP and TSH.  She takes oral B12.  MRI of brain and cervical spine without contrast on 09/23/2020 showed nonspecific multiple small T2 hyperintensities within the cerebral white matter as well as partial empty sella but no spinal cord abnormalities.  She underwent neuropsychological evaluation in July 2022, which demonstrated deficiencies in launguage, some aspects of executive functioning and visuospatial perception, meeting criteria for mild neurocognitive disorder.  Etiology unclear.  The report mentions that executive dysfunction and very mild retrieval-based memory difficulties may be related to nonspecific T2 hyperintensities in the frontal-subcortical region on MRI but also concern for early stages of PPA given language difficulties.Mild anxiety, sleep difficulties and some degree of somatization may be contributing factors.     Past NSAIDS/analgesics:  none Past abortive triptans:  none Past abortive ergotamine:  none Past muscle relaxants:  none Past anti-emetic:  none Past antihypertensive medications:  none Past antidepressant medications:  Cymbalta Past anticonvulsant medications:  topiramate Past anti-CGRP:  none Past vitamins/Herbal/Supplements:  none Past antihistamines/decongestants:  none Other past therapies:  none    PAST MEDICAL HISTORY: Past Medical History:  Diagnosis Date   Depression    Fibromyalgia    Hypertension    URI (upper respiratory infection)     MEDICATIONS: Current Outpatient Medications on File Prior to Visit  Medication Sig Dispense Refill   albuterol (VENTOLIN HFA) 108 (90 Base) MCG/ACT inhaler INHALE 2 PUFFS BY MOUTH EVERY 4 TO 6 HOURS AS NEEDED FOR COUGH FOR WHEEZING FOR SHORTNESS OF BREATH     baclofen (LIORESAL) 10  MG tablet TAKE 1 TABLET(10 MG) BY MOUTH THREE TIMES DAILY 90 tablet 0   BD PEN NEEDLE NANO 2ND GEN 32G X 4 MM MISC FOR USE WITH VICTOZA ONCE A DAY ONCE DAILY     ergocalciferol (VITAMIN D2) 1.25 MG (50000 UT) capsule TAKE 1 CAPSULE BY MOUTH ONCE A WEEK FOR 12 WEEKS     fluticasone (FLONASE) 50 MCG/ACT nasal spray Use 1 spray in each nostril 2 times daily for 5 days.     ibuprofen (ADVIL) 800 MG tablet Take by mouth.     loratadine (CLARITIN) 10 MG tablet TAKE 1 TABLET BY MOUTH ONCE DAILY AT NIGHT     montelukast (SINGULAIR) 10 MG tablet Take 10 mg by mouth at bedtime.     omeprazole (PRILOSEC) 20 MG capsule Take 20 mg by mouth daily as needed.     propranolol (INDERAL) 80 MG tablet Take 80 mg by mouth daily.     tiZANidine (ZANAFLEX) 2 MG tablet Take 1 tab po BID as needed for muscle spasms; caution may cause sedation     valACYclovir (VALTREX) 1000 MG tablet Take 1,000 mg by mouth daily.     VICTOZA 18 MG/3ML SOPN Inject into the skin.     No current facility-administered medications on file prior to visit.    ALLERGIES: Allergies  Allergen Reactions   Latex Hives, Itching and Rash   Shellfish Allergy Anaphylaxis and Swelling   Wheat Bran Rash  Other reaction(s): Abdominal Pain     Almond (Diagnostic) Swelling   Barley Grass Other (See Comments)    Upset stomach   Dairycare [Lactase-Lactobacillus] Diarrhea   Gramineae Pollens Itching    FAMILY HISTORY: Family History  Problem Relation Age of Onset   Hypertension Mother    Diabetes Father    Hyperlipidemia Father    Diabetes Sister    Dementia Maternal Grandmother    Birth defects Maternal Grandfather    Dementia Paternal Grandfather       Objective:  *** General: No acute distress.  Patient appears well-groomed.   Head:  Normocephalic/atraumatic Eyes:  Fundi examined but not visualized Neck: supple, no paraspinal tenderness, full range of motion Heart:  Regular rate and rhythm Lungs:  Clear to auscultation  bilaterally Back: No paraspinal tenderness Neurological Exam: alert and oriented to person, place, and time.  Speech fluent and not dysarthric, language intact.  CN II-XII intact. Bulk and tone normal, muscle strength 5/5 throughout.  Sensation to light touch intact.  Deep tendon reflexes 2+ throughout, toes downgoing.  Finger to nose testing intact.  Gait normal, Romberg negative.   Shon Millet, DO

## 2021-10-03 ENCOUNTER — Telehealth (HOSPITAL_COMMUNITY): Payer: Self-pay | Admitting: Pharmacy Technician

## 2021-10-03 ENCOUNTER — Encounter: Payer: Self-pay | Admitting: Neurology

## 2021-10-03 ENCOUNTER — Other Ambulatory Visit (HOSPITAL_COMMUNITY): Payer: Self-pay

## 2021-10-03 ENCOUNTER — Ambulatory Visit (INDEPENDENT_AMBULATORY_CARE_PROVIDER_SITE_OTHER): Payer: Medicare Other | Admitting: Neurology

## 2021-10-03 ENCOUNTER — Other Ambulatory Visit (INDEPENDENT_AMBULATORY_CARE_PROVIDER_SITE_OTHER): Payer: Medicare Other

## 2021-10-03 VITALS — BP 135/81 | HR 84 | Ht 64.0 in | Wt 364.0 lb

## 2021-10-03 DIAGNOSIS — G43109 Migraine with aura, not intractable, without status migrainosus: Secondary | ICD-10-CM | POA: Diagnosis not present

## 2021-10-03 DIAGNOSIS — G3184 Mild cognitive impairment, so stated: Secondary | ICD-10-CM

## 2021-10-03 DIAGNOSIS — R202 Paresthesia of skin: Secondary | ICD-10-CM

## 2021-10-03 DIAGNOSIS — R200 Anesthesia of skin: Secondary | ICD-10-CM | POA: Diagnosis not present

## 2021-10-03 DIAGNOSIS — R9089 Other abnormal findings on diagnostic imaging of central nervous system: Secondary | ICD-10-CM

## 2021-10-03 MED ORDER — AIMOVIG 140 MG/ML ~~LOC~~ SOAJ: 140.0000 mg | SUBCUTANEOUS | 5 refills | Status: DC

## 2021-10-03 NOTE — Telephone Encounter (Signed)
Patient Advocate Encounter  Prior Authorization for Aimovig 140MG /ML auto-injectors has been approved.    PA# [id number] Effective dates: 10/03/2021 through 04/28/2022  Patients co-pay is $0.00.     Lyndel Safe, [place] Patient Advocate Specialist [place] Patient Advocate Team Direct Number: [phone]  Fax: [phone]

## 2021-10-03 NOTE — Patient Instructions (Signed)
Plan to start Aimovig injection every 28 days Continue topiramate  Check ANCA panel and Sjogren's panel EMG of right arm and leg.   Follow up for memory testing Follow up with me in 4 months.

## 2021-10-03 NOTE — Telephone Encounter (Signed)
-----   Message from Leida Lauth, CMA sent at 10/03/2021  9:34 AM EDT ----- Regarding: PA- Aimovig 140 mg Hello Team, If you could start a PA for this patient. She has two insurances.

## 2021-10-03 NOTE — Telephone Encounter (Signed)
Patient Advocate Encounter   Received notification that prior authorization for Aimovig 140MG /ML auto-injectors is required.   PA submitted on 10/03/2021 Key [id number] Status is pending       Lyndel Safe, [place] Patient Advocate Specialist [place] Patient Advocate Team Direct Number: [phone]  Fax: [phone]

## 2021-10-07 LAB — SJOGRENS SYNDROME-B EXTRACTABLE NUCLEAR ANTIBODY: SSB (La) (ENA) Antibody, IgG: <1 AI

## 2021-10-07 LAB — ANCA SCREEN W REFLEX TITER: ANCA SCREEN: NEGATIVE

## 2021-10-07 LAB — SJOGRENS SYNDROME-A EXTRACTABLE NUCLEAR ANTIBODY: SSA (Ro) (ENA) Antibody, IgG: <1 AI

## 2021-11-14 DIAGNOSIS — F329 Major depressive disorder, single episode, unspecified: Secondary | ICD-10-CM | POA: Insufficient documentation

## 2021-11-14 HISTORY — DX: Morbid (severe) obesity due to excess calories: E66.01

## 2021-11-14 HISTORY — DX: Major depressive disorder, single episode, unspecified: F32.9

## 2021-11-26 ENCOUNTER — Encounter: Payer: Self-pay | Admitting: Psychology

## 2021-11-26 DIAGNOSIS — G43909 Migraine, unspecified, not intractable, without status migrainosus: Secondary | ICD-10-CM | POA: Insufficient documentation

## 2021-11-26 DIAGNOSIS — M549 Dorsalgia, unspecified: Secondary | ICD-10-CM | POA: Insufficient documentation

## 2021-11-27 ENCOUNTER — Ambulatory Visit (INDEPENDENT_AMBULATORY_CARE_PROVIDER_SITE_OTHER): Payer: Medicare Other | Admitting: Psychology

## 2021-11-27 ENCOUNTER — Ambulatory Visit: Payer: Medicare Other | Admitting: Psychology

## 2021-11-27 ENCOUNTER — Encounter: Payer: Self-pay | Admitting: Psychology

## 2021-11-27 DIAGNOSIS — R4189 Other symptoms and signs involving cognitive functions and awareness: Secondary | ICD-10-CM

## 2021-11-27 DIAGNOSIS — G3184 Mild cognitive impairment, so stated: Secondary | ICD-10-CM | POA: Diagnosis not present

## 2021-11-27 DIAGNOSIS — M797 Fibromyalgia: Secondary | ICD-10-CM | POA: Diagnosis not present

## 2021-11-27 DIAGNOSIS — F331 Major depressive disorder, recurrent, moderate: Secondary | ICD-10-CM | POA: Diagnosis not present

## 2021-11-27 HISTORY — DX: Mild cognitive impairment of uncertain or unknown etiology: G31.84

## 2021-11-27 NOTE — Progress Notes (Unsigned)
NEUROPSYCHOLOGICAL EVALUATION . [facility]  Date of Evaluation: November 27, 2021  Reason for Referral:   Jordana Dugue is a 41 y.o. right-handed African-American female referred by Metta Clines, D.O., to characterize her current cognitive functioning and assist with diagnostic clarity and treatment planning in the context of subjective cognitive decline, fibromyalgia, and psychiatric comorbidities.   Assessment and Plan:   Clinical Impression(s): Ms. Attridge's pattern of performance is suggestive of a consistent weakness surrounding verbal fluency, as well as performance variability surrounding attention/concentration, executive functioning, and encoding (i.e., learning) and retrieval aspects of verbal memory. Despite a normative weakness across a confrontation naming task, Ms. Kiener expressed unfamiliarity with several objects, suggesting that her low performance may simply be due to this rather than true word finding deficits. Performances were appropriate across processing speed, receptive language, other assessed expressive language not mentioned above, visuospatial abilities, and all aspects of visual memory. Ms. Harrigan largely denied difficulties completing instrumental activities of daily living (ADLs) independently. 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 However, the mild nature of this should be emphasized at the present time.  The etiology of cognitive weakness and/or variability is most likely mixed. Ms. Wardle has a long history of what appears to be significantly debilitating symptoms of fibromyalgia. This condition has long been associated with "brain fog" and cognitive inefficiencies surrounding attention/concentration, executive functioning, verbal fluency, and encoding/retrieval aspects of memory are  common. Her current pattern across testing is certainly reasonable given her history. In addition to this, she reported resuming Topamax/topiramate as front-line treatment for much of her symptoms. This medication also has well-known cognitive side effects which commonly influence the same areas affected by fibromyalgia, generally to a mild degree. Both of these factors are believed to be playing a prominent role in her current functioning.   Additionally, Ms. Fanguy reported some ongoing psychiatric distress. Despite some mild validity concerns including Ms. Doe elevating the positive impression management scale (suggesting that she may have been portraying herself in an overly positive light across questionnaires), she still elevated clinical subscales surrounding somatic complaints and depression. Her responses suggest an unusually high degree of concern surrounding physical functioning to the extent that this preoccupation may be compromising cognitive functioning (likely the influence of fibromyalgia). Responses also suggested difficulties consistent with a significant depressive experience. Depressive symptoms seem marked by physiological symptoms (e.g., sleep disturbances, decreased energy, decreased libido, loss of appetite). Fortunately, she did not describe significant symptoms of worthlessness or hopelessness. Ongoing depressive symptoms can certainly compromise cognitive efficiency and would be reasonably expected to influence the same domains described above also affected by fibromyalgia and medication side effects. At the present time, the combination of these variables represents the most likely cause for her testing profile and her subjective dysfunction reported during interview.   Relative to her previous evaluation in July 2022, overall patterns were quite similar. Despite somewhat different instrumentation, I do not see any compelling evidence to suggest progressive cognitive decline.  Despite her prior evaluation wondering about a possible primary progressive aphasia (PPA) presentation given language dysfunction, it is my belief that these difficulties are primarily driven by a lack of familiarity with stimuli content, coupled with the influence of variables described above. Recent neuroimaging also did not reveal any concerning pattern of atrophy in this regard.   It is worth highlighting that patterns across testing could be reasonably  seen in multiple sclerosis (MS). In fact, her age, gender, report of physical symptoms (including balance and vision concerns), and report of brain fog all would reflect a fairly classic MS profile. While she did not report a relapsing/remitting pattern of functioning, her 2016 event where she was diagnosed with fibromyalgia could theoretically represent her first relapse followed by gradual symptom improvement, with a second event having simply not occurred yet. Prior brain MRIs revealed reportedly unchanged T2 hyperintensities which is reassuring. However, I do not have enough supplemental information, including prior spinal cord MRIs or lumbar puncture results to review (outside of a cervical MRI performed in May 2022) to strengthen concerns for this illness at the present time. Continued medical monitoring over time will certainly be important.  Recommendations: Should she experience pronounced cognitive and/or functional decline in the future, a repeat neuropsychological evaluation would be warranted at that time. The current evaluation will serve as an excellent baseline to compare future evaluations against.  If there are greater concerns for MS held by her medical team, she should discuss undergoing a lumbar puncture and MRI imaging of her spinal cord with either Dr. Tomi Likens or her PCP.   Ms. Meenan reported notable benefits since restarting topiramate in her overall functioning. It may be that mild cognitive side effects are worth experiencing  given the strength of these benefits. She should consider this when discussing future medication adjustments with her medical team.  A combination of medication and psychotherapy has been shown to be most effective at treating symptoms of anxiety and depression. As such, Ms. Louis is encouraged to speak with her prescribing physician regarding medication adjustments to optimally manage these symptoms.   Ms. Prout stated that she was going to begin individual psychotherapy within the next month. I definitely support this decision. She would benefit from an active and collaborative therapeutic environment, rather than one purely supportive in nature. Recommended treatment modalities would include Cognitive Behavioral Therapy (CBT) or Acceptance and Commitment Therapy (ACT).  Ms. Rosenau is encouraged to attend to lifestyle factors for brain health (e.g., regular physical exercise, good nutrition habits, regular participation in cognitively-stimulating activities, and general stress management techniques), which are likely to have benefits for both emotional adjustment and cognition. In fact, in addition to promoting good general health, regular exercise incorporating aerobic activities (e.g., brisk walking, jogging, cycling, etc.) has been demonstrated to be a very effective treatment for depression and stress, with similar efficacy rates to both antidepressant medication and psychotherapy. Optimal control of vascular risk factors (including safe cardiovascular exercise and adherence to dietary recommendations) is encouraged. Continued participation in activities which provide mental stimulation and social interaction is also recommended.   When learning new information, she would benefit from information being broken up into small, manageable pieces. She may also find it helpful to articulate the material in her own words and in a context to promote encoding at the onset of a new task. This material may need  to be repeated multiple times to promote encoding.  Memory can be improved using internal strategies such as rehearsal, repetition, chunking, mnemonics, association, and imagery. External strategies such as written notes in a consistently used memory journal, visual and nonverbal auditory cues such as a calendar on the refrigerator or appointments with alarm, such as on a cell phone, can also help maximize recall. Reducing anxiety may also aid with information retrieval.  To address problems with fluctuating attention and executive dysfunction, she may wish to consider:   -Avoiding external distractions when  needing to concentrate   -Limiting exposure to fast paced environments with multiple sensory demands   -Writing down complicated information and using checklists   -Attempting and completing one task at a time (i.e., no multi-tasking)   -Verbalizing aloud each step of a task to maintain focus   -Taking frequent breaks during the completion of steps/tasks to avoid fatigue   -Reducing the amount of information considered at one time  Review of Records:   Ms. Borras completed a comprehensive neuropsychological evaluation Dalbert Garnet, Psy.D. and Maryellen Pile, Ph.D.) on 11/20/2020. Results at that time suggested low normative performances across confrontation naming and semantic fluency. Additional weaknesses were exhibited across executive functioning, with further variability across visuospatial abilities and a list-based verbal memory task. 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 There was also some expressed concern surrounding a primary progressive aphasia (PPA) presentation given age-advanced language impairment. Repeat testing was recommended.   Ms. Bilello was seen by [facility] Metta Clines, D.O.) on 06/05/2021 for an evaluation of  migraine headaches and cognitive dysfunction. Briefly, Ms. Jirak began experiencing new headaches in 2022. She described 5-6/10 pounding right parietal headaches with mild nausea and osmophobia but no vomiting, photophobia, phonophobia, visual disturbance, dizziness, or unilateral numbness or weakness. Symptoms last 1-2 hours but occur three times daily. She reported no known triggers. She also began having episodes of numbness and tingling, initially across the entire head and body. She has a history of fibromyalgia (diagnosed around 2016), which was being treated with topiramate. She also reported onset of balance issues where she would feel unsteady when she first stands up. This was said to have improved with time.   Memory dysfunction was first reported in 2016 around the time she was diagnosed with fibromyalgia. Specific symptoms included brain fog and generalized forgetfulness. 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 She also reported trouble remembering to take medications regularly, as well as word finding deficits and occasional bouts of slurred speech.   She started outpatient speech therapy on 08/27/2021. Notes from that initial session suggested: "In review of attention/memory compensations education initiated last session, good implementation reported. Pt purchased agenda and inserted all upcoming appointments with specifics and set two alarms to aid recall. Pt has becoming increasingly aware of how external distractions impact overall cognitive functioning, in which pt has now began initiating modifications to reduce distractions (turning down television, working in another room, focusing on one task at a time). Structured attention tasks completed with focus on implementing targeted strategies (reading instructions twice to aid attention and comprehension, using attention strategies as needed). Pt completed  initial attention with 100% accuracy with good selective and sustained attention exhibited, even with inclusion of mild background stimuli. Second structured sequencing task required increased processing time and occasional min to mod A to ID errors and attend to all important details for more complex sequencing task." She completed several additional sessions, ultimately being discharged from outpatient services on 09/25/2021 due to strong performances.   She most recently met with Dr. Tomi Likens on 10/03/2021. She was started on nortriptyline in February. She experienced side effects and was switched back to topiramate. While off the latter medication, no improvements in numbness or tingling were reported. Overall, she generally described symptom improvement. Ultimately, Ms. Mancusi was referred for a repeat neuropsychological evaluation to characterize her cognitive abilities and  to assist with diagnostic clarity and treatment planning.   Brain MRI on 09/24/2020 revealed multiple small T2 hyperintense lesions in a nonspecific distribution but was otherwise unremarkable. Cervical spine MRI on 09/24/2021 did not reveal any sclerotic lesions. Brain MRI on 08/31/2021 revealed unchanged T2 lesions and no mention of abnormal brain volume.   Past Medical History:  Diagnosis Date   Abdominal pain 11/20/2011   Atypical facial pain 11/20/2011   Backache    Bulging lumbar disc 11/03/2014   Cardiac dysrhythmia 08/12/2011   Carpal tunnel syndrome 11/20/2011   Cholecystitis 06/16/2016   Diplopia 11/20/2011   Edema 11/20/2011   Elevated blood sugar 11/03/2014   Essential hypertension 08/12/2011   Fibromyalgia    Gastroesophageal reflux disease 11/04/2014   Gout 11/20/2011   Herpes simplex 11/20/2011   Iron deficiency anemia secondary to inadequate dietary iron intake 11/20/2011   Lytic lesion of bone on x-ray 05/17/2020   Major depressive disorder 11/14/2021   Malaise and fatigue 11/20/2011   Migraine headache     Morbid obesity with BMI of 60.0-69.9, adult 11/14/2021   Myalgia 11/20/2011   Nausea with vomiting 11/20/2011   Palpitations 08/30/2011   Pruritus of genitalia 11/20/2011   Shortness of breath 08/30/2011   URI (upper respiratory infection)     Past Surgical History:  Procedure Laterality Date   BACK SURGERY     bioposy in 2009   CHOLECYSTECTOMY      Current Outpatient Medications:    albuterol (VENTOLIN HFA) 108 (90 Base) MCG/ACT inhaler, INHALE 2 PUFFS BY MOUTH EVERY 4 TO 6 HOURS AS NEEDED FOR COUGH FOR WHEEZING FOR SHORTNESS OF BREATH, Disp: , Rfl:    baclofen (LIORESAL) 10 MG tablet, TAKE 1 TABLET(10 MG) BY MOUTH THREE TIMES DAILY (Patient not taking: Reported on 10/03/2021), Disp: 90 tablet, Rfl: 0   BD PEN NEEDLE NANO 2ND GEN 32G X 4 MM MISC, FOR USE WITH VICTOZA ONCE A DAY ONCE DAILY, Disp: , Rfl:    Erenumab-aooe (AIMOVIG) 140 MG/ML SOAJ, Inject 140 mg into the skin every 28 (twenty-eight) days., Disp: 1.12 mL, Rfl: 5   ergocalciferol (VITAMIN D2) 1.25 MG (50000 UT) capsule, TAKE 1 CAPSULE BY MOUTH ONCE A WEEK FOR 12 WEEKS (Patient not taking: Reported on 10/03/2021), Disp: , Rfl:    fluticasone (FLONASE) 50 MCG/ACT nasal spray, Use 1 spray in each nostril 2 times daily for 5 days., Disp: , Rfl:    ibuprofen (ADVIL) 800 MG tablet, Take by mouth. As needed, Disp: , Rfl:    loratadine (CLARITIN) 10 MG tablet, TAKE 1 TABLET BY MOUTH ONCE DAILY AT NIGHT, Disp: , Rfl:    montelukast (SINGULAIR) 10 MG tablet, Take 10 mg by mouth at bedtime., Disp: , Rfl:    naltrexone (DEPADE) 50 MG tablet, Take 1 tablet by mouth daily., Disp: , Rfl:    omeprazole (PRILOSEC) 20 MG capsule, Take 20 mg by mouth daily as needed., Disp: , Rfl:    propranolol (INDERAL) 80 MG tablet, Take 80 mg by mouth daily., Disp: , Rfl:    tiZANidine (ZANAFLEX) 2 MG tablet, Take 1 tab po BID as needed for muscle spasms; caution may cause sedation, Disp: , Rfl:    topiramate ER (QUDEXY XR) 25 MG CS24 sprinkle cap, TAKE 1  CAPSULE BY MOUTH AT BEDTIME FOR 1 WEEK THEN INCREASE TO 2 TABLETS BY MOUTH DAILY AS TOLERATED, Disp: , Rfl:    valACYclovir (VALTREX) 1000 MG tablet, Take 1,000 mg by mouth daily., Disp: , Rfl:  VICTOZA 18 MG/3ML SOPN, Inject into the skin., Disp: , Rfl:   Clinical Interview:   The following information was obtained during a clinical interview with Ms. Ellen and her daughter prior to cognitive testing.  Cognitive Symptoms: Decreased short-term memory: Endorsed. She reported generalized forgetfulness with a primary example surrounding trouble recalling names or other words in the moment.  Decreased long-term memory: Denied. Decreased attention/concentration: Endorsed. She reported prominent difficulties with her mind wandering and her zoning out in the middle of conversations. She also noted trouble with increased distractibility.  Reduced processing speed: Endorsed. Symptoms of brain fog were attributed to fibromyalgia.  Difficulties with executive functions: Endorsed. She noted some trouble with multi-tasking, organization, and impulsivity at times. Large scale personality changes were not reported. However, she did note feeling less outgoing and less energetic in her day-to-day life and while in social settings.  Difficulties with emotion regulation: Denied. Difficulties with receptive language: Denied. Difficulties with word finding: Endorsed. Decreased visuoperceptual ability: Denied.  Trajectory of deficits: Difficulties were especially prevalent in 2016 around the time of her initial diagnosis of fibromyalgia. They have more or less persisted since that time. However, more acutely, she reported an improvement in deficits surrounding memory, attention/concentration, processing speed, and executive functioning. This has coincided with medications being adjusted (her being placed back on topiramate and the addition of naltrexone), as well as her learning beneficial compensatory strategies  for addressing cognitive inefficiency during outpatient speech therapy. Concerning cognitive decline since her previous neuropsychological evaluation one year ago was not reported.   Difficulties completing ADLs: Denied. While there is previous mention of her having trouble remembering to take medications and pay bills on time, she noted that this has greatly improved during the past year. She attributed strategies learned during speech therapy as a major cause of this improvement.   Additional Medical History: History of traumatic brain injury/concussion: Denied. History of stroke: Denied. History of seizure activity: Denied. History of known exposure to toxins: Denied. Symptoms of chronic pain: Endorsed. As stated above, she has a long history of fibromyalgia and diffuse pain, numbness, and tingling sensations throughout her body.  Experience of frequent headaches/migraines: Endorsed. However, since being restarted on topiramate, she noted that headache frequency and severity have both noticeably diminished.  Frequent instances of dizziness/vertigo: Denied. She reported a single bout of vertigo occurring in the beginning if 2023, lasting approximately one week. The cause for this was unknown.   Sensory changes: She utilizes glasses with benefit. However, she continues to report worsening visual changes and reported double and blurred vision at times. The cause for this was unknown. Other sensory changes/difficulties (e.g., hearing, taste, smell) were denied.  Balance/coordination difficulties: Endorsed. She described her balance as "pretty terrible." While she denied any recent falls, her and her daughter noted that she will frequently trip while ambulating and have to catch herself to prevent a fall. The cause for this was known. One side of the body was not said to be weaker or appreciably different from the other.  Other motor difficulties: Denied.  Sleep History: Estimated hours obtained each  night: 7-8 hours. She reported that this represented a significant improvement since previous testing where she was sleeping on average 3-4 hours per night.  Difficulties falling asleep: Denied. Difficulties staying asleep: Denied. Feels rested and refreshed upon awakening: Endorsed.  History of snoring: Endorsed. History of waking up gasping for air: Denied. Her daughter described instances where her mother will wake gasping for air. Ms. Oguinn stated that she  has been tested for sleep apnea on several occasions (including most recently at the beginning of 2023) and testing has not revealed this condition to be present.  Witnessed breath cessation while asleep: Denied.  History of vivid dreaming: Denied. Excessive movement while asleep: Denied. She did report prior instances of jerking sensations in her legs prior to her falling asleep. However, this experience has lessened in frequency over time.  Instances of acting out her dreams: Denied.  Psychiatric/Behavioral Health History: Depression: Endorsed. She acknowledged a history of depressive symptoms, attributed largely to her experience with fibromyalgia. She noted that mood symptoms were most severe around 2016 when she was first diagnosed, to the extent where she did exhibit some passive suicidal ideation. Symptoms have not been present since. Acutely, she described her mood as "pretty good." She noted that she will be establishing care with a therapist in the next month. No current mood-related medication intervention was reported.  Anxiety: Endorsed. As with depressive symptoms, generalized anxiety symptoms were largely attributed to her experience with fibromyalgia.  Mania: Denied. Trauma History: Denied. Visual/auditory hallucinations: Denied. Delusional thoughts: Denied.  Tobacco: Denied. Alcohol: She denied current alcohol consumption as well as a history of problematic alcohol abuse or dependence.  Recreational drugs:  Denied.  Family History: Problem Relation Age of Onset   Hypertension Mother    Diabetes Father    Hyperlipidemia Father    Diabetes Sister    Dementia Maternal Grandmother    Birth defects Maternal Grandfather    Dementia Maternal Grandfather    Dementia Paternal Grandfather    This information was confirmed by Ms. Harris.  Academic/Vocational History: Highest level of educational attainment: 13 years. She graduated from high school and estimated completing one additional year of college. She described herself as an average (B/C) student in academic settings. History and math were noted as likely relative weaknesses.  History of developmental delay: Denied. History of grade repetition: Denied. Enrollment in special education courses: Denied. History of LD/ADHD: Denied.  Employment: She previously worked as a Oncologist. At the time of her previous neuropsychological evaluation, it was stated that she was receiving long-term disability benefits due to her fibromyalgia. 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   Evaluation Results:   Behavioral Observations: Ms. Sumners was accompanied by her daughter, arrived to her appointment on time, and was appropriately dressed and groomed. She appeared alert and oriented. Observed gait and station were within normal limits. Gross motor functioning appeared intact upon informal observation and no abnormal movements (e.g., tremors) were noted. Her affect was generally relaxed and positive, but did range appropriately given the subject being discussed during the clinical interview or the task at hand during testing procedures. Spontaneous speech was fluent and word finding difficulties were not observed during the clinical interview. Thought processes were coherent, organized, and normal in content. Insight into her cognitive difficulties appeared adequate.   During testing,  sustained attention was appropriate. Task engagement was adequate and she persisted when challenged. Several items were missed across a confrontation naming task due to lack of familiarity with the object itself rather than frank word finding deficits. Overall, Ms. Delpozo was cooperative with the clinical interview and subsequent testing procedures.   Adequacy of Effort: The validity of neuropsychological testing is limited by the extent to which the individual being tested may be assumed to have exerted adequate effort during testing. Ms. Nydam expressed her intention to perform to the best of her  abilities and exhibited adequate task engagement and persistence. Scores across stand-alone and embedded performance validity measures were within expectation. As such, the results of the current evaluation are believed to be a valid representation of Ms. Murtagh's current cognitive functioning.  Test Results: Ms. Cowing was fully oriented at the time of the current evaluation.  Intellectual abilities based upon educational and vocational attainment were estimated to be in the average range. Premorbid abilities were estimated to be within the below average range based upon a single-word reading test.   Processing speed was average to above average. Basic attention was well below average. More complex attention (e.g., working memory) was average. Executive functioning was variable, ranging from the exceptionally low to average normative ranges.  Assessed receptive language abilities were average. Likewise, Ms. Enderson did not exhibit any difficulties comprehending task instructions and answered all questions asked of her appropriately. Assessed expressive language was variable. Verbal fluency (i.e., both phonemic and semantic) were well below average and confrontation naming was exceptionally low. However, another oral production task was average, as well as performances across writing samples.    Assessed  visuospatial/visuoconstructional abilities were average to well above average.    Learning (i.e., encoding) of novel verbal and visual information was variable, ranging from the well below average to average normative ranges. Spontaneous delayed recall (i.e., retrieval) of previously learned information was also variable, ranging from the well below average to average normative ranges. Retention rates were 74% across a story learning task, 86% across a list learning task, 82% across a daily living task, and 88% across a shape learning task. Performance across recognition tasks was average to above average, suggesting evidence for information consolidation.   Results of emotional screening instruments suggested that recent symptoms of generalized anxiety were in the minimal range, while symptoms of depression were within the mild range. Across a more comprehensive personality questionnaire, there were some validity concerns surrounding inconsistent responding and positive impression management. However, she still elevated clinical subscales surrounding somatic complaints and depression. A screening instrument assessing recent sleep quality suggested the presence of minimal sleep dysfunction.  Tables of Scores:   Note: This summary of test scores accompanies the interpretive report and should not be considered in isolation without reference to the appropriate sections in the text. Descriptors are based on appropriate normative data and may be adjusted based on clinical judgment. Terms such as "Within Normal Limits" and "Outside Normal Limits" are used when a more specific description of the test score cannot be determined.       Percentile - Normative Descriptor > 98 - Exceptionally High 91-97 - Well Above Average 75-90 - Above Average 25-74 - Average 9-24 - Below Average 2-8 - Well Below Average < 2 - Exceptionally Low       Orientation:      Raw Score Percentile   NAB Orientation, Form 1 29/29  --- ---       Cognitive Screening:      Raw Score Percentile   SLUMS: 23/30 --- ---       Intellectual Functioning:      Standard Score Percentile   Test of Premorbid Functioning: 80 9 Below Average       Memory:     NAB Memory Module, Form 1: Standard Score/ T Score Percentile   Total Memory Index 82 12 Below Average  List Learning       Total Trials 1-3 19/36 (36) 8 Well Below Average    List B 4/12 (43) 25  Average    Short Delay Free Recall 7/12 (42) 21 Below Average    Long Delay Free Recall 6/12 (38) 12 Below Average    Retention Percentage 86 (44) 27 Average    Recognition Discriminability 11 (57) 75 Above Average  Shape Learning       Total Trials 1-3 17/27 (48) 42 Average    Delayed Recall 7/9 (54) 66 Average    Retention Percentage 88 (45) 31 Average    Recognition Discriminability 8 (53) 62 Average  Story Learning       Immediate Recall 55/80 (39) 14 Below Average    Delayed Recall 23/40 (32) 4 Well Below Average    Retention Percentage 74 (29) 2 Well Below Average  Daily Living Memory       Immediate Recall 43/51 (46) 34 Average    Delayed Recall 14/17 (42) 21 Below Average    Retention Percentage 82 (41) 18 Below Average    Recognition Hits 9/10 (47) 38 Average       Attention/Executive Function:     Trail Making Test (TMT): Raw Score (T Score) Percentile     Part A 33 secs., 0 errors (47) 38 Average    Part B 113 secs., 1 error (39) 14 Below Average         Scaled Score Percentile   WAIS-IV Coding: 10 50 Average       NAB Attention Module, Form 1: T Score Percentile     Digits Forward 32 4 Well Below Average    Digits Backwards 47 38 Average       D-KEFS Color-Word Interference Test: Raw Score (Scaled Score) Percentile     Color Naming 23 secs. (13) 84 Above Average    Word Reading 21 secs. (11) 63 Average    Inhibition 95 secs. (1) <1 Exceptionally Low      Total Errors 9 errors (1) <1 Exceptionally Low    Inhibition/Switching 75 secs. (8) 25  Average      Total Errors 1 error (11) 63 Average       D-KEFS Verbal Fluency Test: Raw Score (Scaled Score) Percentile     Letter Total Correct 21 (5) 5 Well Below Average    Category Total Correct 26 (4) 2 Well Below Average    Category Switching Total Correct 12 (8) 25 Average    Category Switching Accuracy 11 (9) 37 Average      Total Set Loss Errors 1 (11) 63 Average      Total Repetition Errors 2 (10) 50 Average       Language:     Verbal Fluency Test: Raw Score (T Score) Percentile     Phonemic Fluency (FAS) 21 (32) 4 Well Below Average    Animal Fluency 11 (33) 5 Well Below Average        NAB Language Module, Form 1: T Score Percentile     Oral Production 45 31 Average    Auditory Comprehension 56 73 Average    Reading Comprehension 13/13 --- Within Normal Limits    Naming 25/31 (23) <1 Exceptionally Low    Writing 53 62 Average       Visuospatial/Visuoconstruction:      Raw Score Percentile   Clock Drawing: 10/10 --- Within Normal Limits       NAB Spatial Module, Form 1: T Score Percentile     Figure Drawing Copy 69 97 Well Above Average    Figure Drawing Immediate Recall 52 58 Average  Scaled Score Percentile   WAIS-IV Block Design: 8 25 Average  WAIS-IV Matrix Reasoning: 11 63 Average       Mood and Personality:      Raw Score Percentile   Beck Depression Inventory - II: 18 --- Mild  PROMIS Anxiety Questionnaire: 12 --- None to Slight       Personality Assessment Inventory: T Score  Percentile     Inconsistency 70 --- Moderate    Infrequency 67 --- Moderate    Negative Impression 51 --- Within Normal Limits    Positive Impression 61 --- Moderate    Somatic Complaints 74 --- Elevated    Anxiety 46 --- Within Normal Limits    Anxiety-Related Disorders 44 --- Within Normal Limits    Depression 71 --- Elevated    Mania 48 --- Within Normal Limits    Paranoia 36 --- Within Normal Limits    Schizophrenia 47 --- Within Normal Limits    Borderline  Features 44 --- Within Normal Limits    Antisocial Features 50 --- Within Normal Limits    Alcohol Problems 52 --- Within Normal Limits    Drug Problems 66 --- Within Normal Limits    Aggression 34 --- Within Normal Limits    Suicidal Ideation 51 --- Within Normal Limits    Stress 55 --- Within Normal Limits    Non Support 50 --- Within Normal Limits    Treatment Rejection 48 --- Within Normal Limits    Dominance 45 --- Within Normal Limits    Warmth 60 --- Within Normal Limits       Additional Questionnaires:      Raw Score Percentile   PROMIS Sleep Disturbance Questionnaire: 12 --- None to Slight   Informed Consent and Coding/Compliance:   The current evaluation represents a clinical evaluation for the purposes previously outlined by the referral source and is in no way reflective of a forensic evaluation.   Ms. Kadow was provided with a verbal description of the nature and purpose of the present neuropsychological evaluation. Also reviewed were the foreseeable risks and/or discomforts and benefits of the procedure, limits of confidentiality, and mandatory reporting requirements of this provider. The patient was given the opportunity to ask questions and receive answers about the evaluation. Oral consent to participate was provided by the patient.   This evaluation was conducted by Christia Reading, Ph.D., ABPP-CN, board certified clinical neuropsychologist. Ms. Amble completed a clinical interview with Dr. Melvyn Novas, billed as one unit [phone], and 200 minutes of cognitive testing and scoring, billed as one unit [phone] and six additional units [id number]. Psychometrist Milana Kidney, B.S., assisted Dr. Melvyn Novas with test administration and scoring procedures. As a separate and discrete service, Dr. Melvyn Novas spent a total of 160 minutes in interpretation and report writing billed as one unit [phone] and two units 96133.

## 2021-11-27 NOTE — Progress Notes (Signed)
   Psychometrician Note   Cognitive testing was administered to Emily Ochoa by Wallace Keller, B.S. (psychometrist) under the supervision of Dr. Newman Nickels, Ph.D., licensed psychologist on 11/27/2021. Emily Ochoa did not appear overtly distressed by the testing session per behavioral observation or responses across self-report questionnaires. Rest breaks were offered.    The battery of tests administered was selected by Dr. Newman Nickels, Ph.D. with consideration to Emily Ochoa's current level of functioning, the nature of her symptoms, emotional and behavioral responses during interview, level of literacy, observed level of motivation/effort, and the nature of the referral question. This battery was communicated to the psychometrist. Communication between Dr. Newman Nickels, Ph.D. and the psychometrist was ongoing throughout the evaluation and Dr. Newman Nickels, Ph.D. was immediately accessible at all times. Dr. Newman Nickels, Ph.D. provided supervision to the psychometrist on the date of this service to the extent necessary to assure the quality of all services provided.    Emily Ochoa will return within approximately 1-2 weeks for an interactive feedback session with Dr. Milbert Coulter at which time her test performances, clinical impressions, and treatment recommendations will be reviewed in detail. Emily Ochoa understands she can contact our office should she require our assistance before this time.  A total of 200 minutes of billable time were spent face-to-face with Emily Ochoa by the psychometrist. This includes both test administration and scoring time. Billing for these services is reflected in the clinical report generated by Dr. Newman Nickels, Ph.D.  This note reflects time spent with the psychometrician and does not include test scores or any clinical interpretations made by Dr. Milbert Coulter. The full report will follow in a separate note.

## 2021-11-28 ENCOUNTER — Encounter: Payer: Self-pay | Admitting: Psychology

## 2021-12-04 ENCOUNTER — Encounter: Payer: Medicare Other | Admitting: Psychology

## 2021-12-11 ENCOUNTER — Ambulatory Visit (INDEPENDENT_AMBULATORY_CARE_PROVIDER_SITE_OTHER): Payer: Medicare Other | Admitting: Psychology

## 2021-12-11 DIAGNOSIS — F331 Major depressive disorder, recurrent, moderate: Secondary | ICD-10-CM | POA: Diagnosis not present

## 2021-12-11 DIAGNOSIS — G3184 Mild cognitive impairment, so stated: Secondary | ICD-10-CM | POA: Diagnosis not present

## 2021-12-11 DIAGNOSIS — M797 Fibromyalgia: Secondary | ICD-10-CM | POA: Diagnosis not present

## 2021-12-11 NOTE — Progress Notes (Signed)
   Neuropsychology Feedback Session Emily Ochoa. [facility] [place] Department of Neurology  Reason for Referral:   Emily Ochoa is a 41 y.o. right-handed African-American female referred by Shon Millet, D.O., to characterize her current cognitive functioning and assist with diagnostic clarity and treatment planning in the context of subjective cognitive decline, fibromyalgia, and psychiatric comorbidities.   Feedback:   [name] completed a comprehensive neuropsychological evaluation on 11/27/2021. Please refer to that encounter for the full report and recommendations. Briefly, results suggested a consistent weakness surrounding verbal fluency, as well as performance variability surrounding attention/concentration, executive functioning, and encoding (i.e., learning) and retrieval aspects of verbal memory. Relative to her previous evaluation in July 2022, overall patterns were quite similar. Despite somewhat different instrumentation, I do not see any compelling evidence to suggest progressive cognitive decline. The etiology of cognitive weakness and/or variability is most likely mixed. [name] has a long history of what appears to be significantly debilitating symptoms of fibromyalgia. This condition has long been associated with "brain fog" and cognitive inefficiencies surrounding attention/concentration, executive functioning, verbal fluency, and encoding/retrieval aspects of memory are common. Her current pattern across testing is certainly reasonable given her history. In addition to this, she reported resuming Topamax/topiramate as front-line treatment for much of her symptoms. This medication also has well-known cognitive side effects which commonly influence the same areas affected by fibromyalgia, generally to a mild degree. Both of these factors are believed to be playing a prominent role in her current functioning. Additionally, Ms. Hunger reported some ongoing psychiatric  distress in the form of depression. At the present time, the combination of these variables represents the most likely cause for her testing profile and her subjective dysfunction reported during interview.   [name] was unaccompanied during the current telephone call. She was within her residence while I was within my office. I discussed the limitations of evaluation and management by telemedicine and the availability of in person appointments. [name] expressed her understanding and agreed to proceed. Content of the current session focused on the results of her neuropsychological evaluation. [name] was given the opportunity to ask questions and her questions were answered. She was encouraged to reach out should additional questions arise. A copy of her report was mailed at the conclusion of the visit.      20 minutes were spent conducting the current feedback session with [name], billed as one unit [phone].

## 2021-12-17 ENCOUNTER — Other Ambulatory Visit: Payer: Self-pay | Admitting: Physical Medicine and Rehabilitation

## 2021-12-17 DIAGNOSIS — M5416 Radiculopathy, lumbar region: Secondary | ICD-10-CM

## 2022-01-14 ENCOUNTER — Encounter: Payer: Medicare Other | Admitting: Neurology

## 2022-01-16 ENCOUNTER — Ambulatory Visit (INDEPENDENT_AMBULATORY_CARE_PROVIDER_SITE_OTHER): Payer: Medicare Other | Admitting: Neurology

## 2022-01-16 DIAGNOSIS — R202 Paresthesia of skin: Secondary | ICD-10-CM

## 2022-01-16 DIAGNOSIS — R200 Anesthesia of skin: Secondary | ICD-10-CM

## 2022-01-16 DIAGNOSIS — G5601 Carpal tunnel syndrome, right upper limb: Secondary | ICD-10-CM

## 2022-01-16 NOTE — Procedures (Signed)
[facility] Neurology  [address] [place], [address]  [place], [place] [address] Tel: [phone] Fax:  [phone] Test Date:  01/16/2022  Patient: Emily Ochoa DOB: 07-07-1980 Physician: Nita Sickle, DO  Sex: Female Height: 5\' 4"  Ref Phys: , D.O.  ID#: Shon Millet   Technician:    Patient Complaints: This is a 41 year old female referred for evaluation of paresthesias of the arms and legs.  NCV & EMG Findings: Extensive electrodiagnostic testing of the right upper and lower extremity shows:  Right median sensory response shows prolonged latency (3.8 ms).  Right ulnar, sural, and superficial peroneal sensory responses are within normal limits. Right median motor response shows prolonged latency (4.1 ms).  Right ulnar, peroneal, and tibial motor responses are within normal limits.   Right tibial H reflex study is normal.   There is no evidence of active or chronic motor axonal loss changes affecting any of the tested muscles.  Motor unit configuration and recruitment pattern is within normal limits.    Impression: Right median neuropathy at or distal to the wrist, consistent with a clinical diagnosis of carpal tunnel syndrome.  Overall, these findings are moderate in degree electrically. There is no evidence of a large fiber sensorimotor polyneuropathy or cervical/lumbosacral radiculopathy affecting the right side.   ___________________________ 46, DO    Nerve Conduction Studies Anti Sensory Summary Table   Stim Site NR Peak (ms) Norm Peak (ms) O-P Amp (V) Norm O-P Amp  Right Median Anti Sensory (2nd Digit)  32C  Wrist    3.8 <3.4 37.3 >20  Right Sup Peroneal Anti Sensory (Ant Lat Mall)  32C  12 cm    2.2 <4.5 15.8 >5  Right Sural Anti Sensory (Lat Mall)  32C  Calf    2.6 <4.5 17.4 >5  Right Ulnar Anti Sensory (5th Digit)  32C  Wrist    2.6 <3.1 54.2 >12   Motor Summary Table   Stim Site NR Onset (ms) Norm Onset (ms) O-P Amp (mV) Norm O-P Amp  Site1 Site2 Delta-0 (ms) Dist (cm) Vel (m/s) Norm Vel (m/s)  Right Median Motor (Abd Poll Brev)  32C  Wrist    4.1 <3.9 9.3 >6 Elbow Wrist 4.7 27.0 57 >50  Elbow    8.8  8.7         Right Peroneal Motor (Ext Dig Brev)  32C  Ankle    3.3 <5.5 9.7 >3 B Fib Ankle 7.3 36.0 49 >40  B Fib    10.6  9.0  Poplt B Fib 1.0 6.0 60 >40  Poplt    11.6  8.3         Right Tibial Motor (Abd Hall Brev)  32C  Ankle    5.2 <6.0 9.9 >8 Knee Ankle 8.6 39.0 45 >40  Knee    13.8  7.1         Right Ulnar Motor (Abd Dig Minimi)  32C  Wrist    2.0 <3.1 10.2 >7 B Elbow Wrist 3.6 24.0 67 >50  B Elbow    5.6  9.4  A Elbow B Elbow 1.6 10.0 62 >50  A Elbow    7.2  9.5          H Reflex Studies   NR H-Lat (ms) Lat Norm (ms) L-R H-Lat (ms)  Right Tibial (Gastroc)  32C     31.84 <35    EMG   Side Muscle Ins Act Fibs Fasc Recrt Dur. Amp. Poly. Activation Comment  Right AntTibialis Nml Nml Nml Nml Nml Nml Nml Nml N/A  Right Gastroc Nml Nml Nml Nml Nml Nml Nml Nml N/A  Right RectFemoris Nml Nml Nml Nml Nml Nml Nml Nml N/A  Right BicepsFemS Nml Nml Nml Nml Nml Nml Nml Nml N/A  Right 1stDorInt Nml Nml Nml Nml Nml Nml Nml Nml N/A  Right Abd Poll Brev Nml Nml Nml Nml Nml Nml Nml Nml N/A  Right PronatorTeres Nml Nml Nml Nml Nml Nml Nml Nml N/A  Right Biceps Nml Nml Nml Nml Nml Nml Nml Nml N/A  Right Triceps Nml Nml Nml Nml Nml Nml Nml Nml N/A  Right Deltoid Nml Nml Nml Nml Nml Nml Nml Nml N/A      Waveforms:

## 2022-01-17 ENCOUNTER — Telehealth: Payer: Self-pay | Admitting: Neurology

## 2022-01-17 NOTE — Telephone Encounter (Signed)
Pt called in returning a call about results 

## 2022-01-17 NOTE — Telephone Encounter (Signed)
Pt called back no answer left a voice mail to call the office back  

## 2022-01-17 NOTE — Telephone Encounter (Signed)
LMOVM for patient to call the office back.

## 2022-01-17 NOTE — Progress Notes (Signed)
Tried calling patient no answer, LMOVM for patient to call the office back.

## 2022-01-18 NOTE — Telephone Encounter (Signed)
Called and left message to call back.

## 2022-01-22 ENCOUNTER — Telehealth: Payer: Self-pay | Admitting: Neurology

## 2022-01-22 NOTE — Telephone Encounter (Signed)
Pt is returning a call to renee about her results ?

## 2022-01-22 NOTE — Telephone Encounter (Signed)
Patient advised of her EMG results.

## 2022-01-27 ENCOUNTER — Ambulatory Visit
Admission: RE | Admit: 2022-01-27 | Discharge: 2022-01-27 | Disposition: A | Discharge status: Home / Self Care | Payer: Medicare Other | Source: Ambulatory Visit | Attending: Physical Medicine and Rehabilitation | Admitting: Physical Medicine and Rehabilitation

## 2022-01-27 DIAGNOSIS — M5416 Radiculopathy, lumbar region: Secondary | ICD-10-CM

## 2022-02-11 NOTE — Progress Notes (Unsigned)
NEUROLOGY FOLLOW UP OFFICE NOTE  Emily Ochoa [id number]  Assessment/Plan:   1  Migraine without aura and migraine with aura - no recent headaches, however she just discontinued topiramate.  I would still start Aimovig in case the topiramate was keeping the migraines controlled. 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  May be related to chronic pain and topiramate, however symptoms in right hand likely carpal tunnel syndrome 4  Abnormal white matter on brain MRI - nonspecific but pattern seems more consistent with chronic small vessel disease or related to migraine.      For management of headaches, start Aimovig 140mg  every 28 days Limit use of pain relievers to no more than 2 days out of week to prevent risk of rebound or medication-overuse headache. Keep headache diary Wear right wrist splint Follow up in 4 months.     Subjective:  Emily Ochoa is a 41 year old female with HTN, fibromyalgia and migraines who follows up for migraines and cognitive deficits   UPDATE: For evaluation of vasculitis, SSA/SSB antibodies and ANCA panel were checked on 10/03/2021, which were normal.  NCV-EMG on 01/16/2022 shoed evidence of moderate right carpal tunnel syndrome at/distal to wrist but no evidence of large fiber sensorimotor polyneuropathy.    Neuropsychological evaluation on 11/27/2021 demonstrated mild cognitive impairment likely related to chronic pain, possibly topiramate and psychiatric stressors.  Her pain specialist tapered her off of topiramate.  Completely discontinued 3 days ago.   Migraines.  Restarted topiramate and headaches improved.  Therefore, she never started Aimovig. No migraines.   Sleep is poor.  Wakes up every hour.  Reportedly had a sleep study and told a CPAP was not indicated.     Current NSAIDS/analgesics:  ibuprofen  (rarely) Current triptans:  none Current ergotamine:  none Current anti-emetic:  none Current muscle relaxants:  baclofen 10mg  TID PRN (leg cramps), tizanidine 4mg  BID PRN Current Antihypertensive medications:  propranolol ER 80mg  daily (higher doses effective but caused side effects - nausea) Current Antidepressant medications:  none Current Anticonvulsant medications:  topiramate XR 50mg  daily Current anti-CGRP:  none Current Vitamins/Herbal/Supplements:  magnesium chloride, alpha-Brain, D3, folic acid, fish oil Current Antihistamines/Decongestants:  loratidine Other therapy:  none Hormone/birth control:  none Other medications:  none     HISTORY:  In 2022, she began experiencing new headaches.  No prior history of headaches.  She describes 5-6/10 pounding right parietal headache with mild nausea and osmophobia but no vomiting, photophobia, phonophobia, visual disturbance, dizziness, unilateral numbness or weakness.  They last about 1-2 hours but occur three times daily.  No known triggers.  Laying down to rest helps.  Takes ibuprofen but no more than 2 days out of the week.     She also began having episodes of numbness and tingling.  Initially she experienced numbness and tingling of the entire head and body.  She was previously taking topiramate for fibromyalgia.  She stopped taking it but symptoms persisted.  She no longer has the full body numbness but now has episodes of right facial numbness and tingling associated with 5-6/10 throbbing right retroorbital pain and eye twitch.  Lasts off and on all day.  She has had about 5 or 6 episodes over the past year.     She started having balance issues, particularly when first standing up.  She would feel unsteady.  No dizziness.  This has overall improved.  She reports onset of memory problems in 2016, around the time she was diagnosed with fibromyalgia.  Specifically, she had brain fog and forgetfullness.  In early 2022, she began  experiencing a steady cognitive decline.  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  She also reports difficulty remembering to take medication.  She started having difficulty completing chores, She sometimes mixes up words.  She has had episodes of slurred speech.     Labs performed in 2022 include sed rate 27 CRP 2, normal SPEP and TSH.  She takes oral B12.  MRI of brain and cervical spine without contrast on 09/23/2020 showed nonspecific multiple small T2 hyperintensities within the cerebral white matter as well as partial empty sella but no spinal cord abnormalities.  She underwent neuropsychological evaluation in July 2022, which demonstrated deficiencies in launguage, some aspects of executive functioning and visuospatial perception, meeting criteria for mild neurocognitive disorder.  Etiology unclear.  The report mentions that executive dysfunction and very mild retrieval-based memory difficulties may be related to nonspecific T2 hyperintensities in the frontal-subcortical region on MRI but also concern for early stages of PPA given language difficulties.Mild anxiety, sleep difficulties and some degree of somatization may be contributing factors.  MRI of brain with and without contrast on 08/30/2021 showed unchanged nonspecific chronic subcortical and periventricular white matter disease likely chronic small vessel ischemic changes.  B12 level from February was 619.    Past NSAIDS/analgesics:  none Past abortive triptans:  none Past abortive ergotamine:  none Past muscle relaxants:  none Past anti-emetic:  none Past antihypertensive medications:  none Past antidepressant medications:  Nortriptyline, Cymbalta Past anticonvulsant medications: none Past anti-CGRP:  none Past vitamins/Herbal/Supplements:  none Past antihistamines/decongestants:  none Other past therapies:  none  PAST MEDICAL HISTORY: Past Medical History:  Diagnosis Date    Abdominal pain 11/20/2011   Atypical facial pain 11/20/2011   Backache    Bulging lumbar disc 11/03/2014   Cardiac dysrhythmia 08/12/2011   Carpal tunnel syndrome 11/20/2011   Cholecystitis 06/16/2016   Diplopia 11/20/2011   Edema 11/20/2011   Elevated blood sugar 11/03/2014   Essential hypertension 08/12/2011   Fibromyalgia    Gastroesophageal reflux disease 11/04/2014   Gout 11/20/2011   Herpes simplex 11/20/2011   Iron deficiency anemia secondary to inadequate dietary iron intake 11/20/2011   Lytic lesion of bone on x-ray 05/17/2020   Major depressive disorder 11/14/2021   Malaise and fatigue 11/20/2011   Migraine headache    Mild cognitive impairment 11/27/2021   Morbid obesity with BMI of 60.0-69.9, adult 11/14/2021   Myalgia 11/20/2011   Nausea with vomiting 11/20/2011   Palpitations 08/30/2011   Pruritus of genitalia 11/20/2011   Shortness of breath 08/30/2011   URI (upper respiratory infection)     MEDICATIONS: Current Outpatient Medications on File Prior to Visit  Medication Sig Dispense Refill   albuterol (VENTOLIN HFA) 108 (90 Base) MCG/ACT inhaler INHALE 2 PUFFS BY MOUTH EVERY 4 TO 6 HOURS AS NEEDED FOR COUGH FOR WHEEZING FOR SHORTNESS OF BREATH     baclofen (LIORESAL) 10 MG tablet TAKE 1 TABLET(10 MG) BY MOUTH THREE TIMES DAILY (Patient not taking: Reported on 10/03/2021) 90 tablet 0   BD PEN NEEDLE NANO 2ND GEN 32G X 4 MM MISC FOR USE WITH VICTOZA ONCE A DAY ONCE DAILY     Erenumab-aooe (AIMOVIG) 140 MG/ML SOAJ Inject 140 mg into the skin every 28 (twenty-eight) days. 1.12 mL 5  ergocalciferol (VITAMIN D2) 1.25 MG (50000 UT) capsule TAKE 1 CAPSULE BY MOUTH ONCE A WEEK FOR 12 WEEKS (Patient not taking: Reported on 10/03/2021)     fluticasone (FLONASE) 50 MCG/ACT nasal spray Use 1 spray in each nostril 2 times daily for 5 days.     ibuprofen (ADVIL) 800 MG tablet Take by mouth. As needed     loratadine (CLARITIN) 10 MG tablet TAKE 1 TABLET BY MOUTH ONCE DAILY AT NIGHT      montelukast (SINGULAIR) 10 MG tablet Take 10 mg by mouth at bedtime.     naltrexone (DEPADE) 50 MG tablet Take 1 tablet by mouth daily.     omeprazole (PRILOSEC) 20 MG capsule Take 20 mg by mouth daily as needed.     propranolol (INDERAL) 80 MG tablet Take 80 mg by mouth daily.     tiZANidine (ZANAFLEX) 2 MG tablet Take 1 tab po BID as needed for muscle spasms; caution may cause sedation     topiramate ER (QUDEXY XR) 25 MG CS24 sprinkle cap TAKE 1 CAPSULE BY MOUTH AT BEDTIME FOR 1 WEEK THEN INCREASE TO 2 TABLETS BY MOUTH DAILY AS TOLERATED     valACYclovir (VALTREX) 1000 MG tablet Take 1,000 mg by mouth daily.     VICTOZA 18 MG/3ML SOPN Inject into the skin.     No current facility-administered medications on file prior to visit.    ALLERGIES: Allergies  Allergen Reactions   Latex Hives, Itching and Rash   Shellfish Allergy Anaphylaxis and Swelling   Wheat Bran Rash    Other reaction(s): Abdominal Pain     Almond (Diagnostic) Swelling   Barley Grass Other (See Comments)    Upset stomach   Dairycare [Lactase-Lactobacillus] Diarrhea   Gramineae Pollens Itching    FAMILY HISTORY: Family History  Problem Relation Age of Onset   Hypertension Mother    Diabetes Father    Hyperlipidemia Father    Diabetes Sister    Dementia Maternal Grandmother    Birth defects Maternal Grandfather    Dementia Maternal Grandfather    Dementia Paternal Grandfather       Objective:  Blood pressure 117/84, pulse 84, resp. rate 18, height 5\' 4"  (1.626 m), weight (!) 361 lb (163.7 kg). General: No acute distress.  Patient appears well-groomed.     Metta Clines, DO

## 2022-02-13 ENCOUNTER — Encounter: Payer: Self-pay | Admitting: Neurology

## 2022-02-13 ENCOUNTER — Ambulatory Visit (INDEPENDENT_AMBULATORY_CARE_PROVIDER_SITE_OTHER): Payer: Medicare Other | Admitting: Neurology

## 2022-02-13 VITALS — BP 117/84 | HR 84 | Resp 18 | Ht 64.0 in | Wt 361.0 lb

## 2022-02-13 DIAGNOSIS — G43109 Migraine with aura, not intractable, without status migrainosus: Secondary | ICD-10-CM | POA: Diagnosis not present

## 2022-02-13 DIAGNOSIS — R202 Paresthesia of skin: Secondary | ICD-10-CM

## 2022-02-13 DIAGNOSIS — G5601 Carpal tunnel syndrome, right upper limb: Secondary | ICD-10-CM

## 2022-02-13 DIAGNOSIS — G3184 Mild cognitive impairment, so stated: Secondary | ICD-10-CM

## 2022-02-13 DIAGNOSIS — R200 Anesthesia of skin: Secondary | ICD-10-CM

## 2022-02-13 DIAGNOSIS — G43009 Migraine without aura, not intractable, without status migrainosus: Secondary | ICD-10-CM | POA: Diagnosis not present

## 2022-02-13 MED ORDER — AIMOVIG 140 MG/ML ~~LOC~~ SOAJ
140.0000 mg | SUBCUTANEOUS | 5 refills | Status: DC
Start: 2022-02-13 — End: 2022-11-04

## 2022-02-13 NOTE — Patient Instructions (Signed)
Start Aimovig 140mg  every 28 days.

## 2022-04-25 ENCOUNTER — Telehealth: Payer: Self-pay

## 2022-04-25 ENCOUNTER — Other Ambulatory Visit (HOSPITAL_COMMUNITY): Payer: Self-pay

## 2022-04-25 NOTE — Telephone Encounter (Signed)
Pharmacy Patient Advocate Encounter   Received notification from CoverMyMeds that prior authorization for Aimovig 140MG /ML auto-injectors is required/requested.  PA submitted on 04/25/22 to (ins) [employer] via CoverMyMeds Key [id number]  Status is pending

## 2022-05-03 ENCOUNTER — Other Ambulatory Visit (HOSPITAL_COMMUNITY): Payer: Self-pay

## 2022-05-03 NOTE — Telephone Encounter (Signed)
Pharmacy Patient Advocate Encounter  Prior Authorization for Aimovig 140MG /ML auto-injectors has been approved.    PA# PA Case ID: [id number] Effective dates: 04/25/2022 through 04/29/2023 Per test billing pt co pay is zero and can be filled with [facility]

## 2022-07-10 NOTE — Progress Notes (Deleted)
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  May be related to chronic pain and topiramate, however symptoms in right hand likely carpal tunnel syndrome 4  Abnormal white matter on brain MRI - nonspecific but pattern seems more consistent with chronic small vessel disease or related to migraine.       Migraine prevention:  Aimovig '140mg'$  every 28 days Limit use of pain relievers to no more than 2 days out of week to prevent risk of rebound or medication-overuse headache. Keep headache diary Wear right wrist splint *** Follow up in 4 months. ***     Subjective:  Tykera Govea is a 42 year old female with HTN, fibromyalgia and migraines who follows up for migraines and cognitive deficits   UPDATE: Due to possibly causing cognitive problems, topiramate was discontinued and she was started on Aimovig.  ***   Current NSAIDS/analgesics:  ibuprofen (rarely) Current triptans:  none Current ergotamine:  none Current anti-emetic:  none Current muscle relaxants:  baclofen '10mg'$  TID PRN (leg cramps), tizanidine '4mg'$  BID PRN Current Antihypertensive medications:  propranolol ER '80mg'$  daily (higher doses effective but caused side effects - nausea) Current Antidepressant medications:  none Current Anticonvulsant medications: none Current anti-CGRP:  Aimovig '140mg'$  Q4wks Current Vitamins/Herbal/Supplements:  magnesium chloride, alpha-Brain, D3, folic acid, fish oil Current Antihistamines/Decongestants:  loratidine Other therapy:  none Hormone/birth control:  none Other medications:  none     HISTORY:  In 2022, she began experiencing new headaches.  No prior history of headaches.  She  describes 5-6/10 pounding right parietal headache with mild nausea and osmophobia but no vomiting, photophobia, phonophobia, visual disturbance, dizziness, unilateral numbness or weakness.  They last about 1-2 hours but occur three times daily.  No known triggers.  Laying down to rest helps.  Takes ibuprofen but no more than 2 days out of the week.     She also began having episodes of numbness and tingling.  Initially she experienced numbness and tingling of the entire head and body.  She was previously taking topiramate for fibromyalgia.  She stopped taking it but symptoms persisted.  She no longer has the full body numbness but now has episodes of right facial numbness and tingling associated with 5-6/10 throbbing right retroorbital pain and eye twitch.  Lasts off and on all day.  She has had about 5 or 6 episodes over the past year.  NCV-EMG on 01/16/2022 shoed evidence of moderate right carpal tunnel syndrome at/distal to wrist but no evidence of large fiber sensorimotor polyneuropathy.     She started having balance issues, particularly when first standing up.  She would feel unsteady.  No dizziness.  This has overall improved.   She reports onset of memory problems in 2016, around the time she was diagnosed with fibromyalgia.  Specifically, she had brain fog and forgetfullness.  In early 2022, she began experiencing a steady cognitive decline.  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  She also reports difficulty remembering to take medication.  She started having difficulty completing chores, She sometimes mixes up words.  She has had episodes of slurred speech.     Labs performed in 2022 include sed rate 27 CRP 2, normal  SPEP and TSH.  She takes oral B12 (.B12 level from February 2023 was 619).   MRI of brain and cervical spine without contrast on 09/23/2020 showed nonspecific multiple small T2 hyperintensities within the cerebral white matter  as well as partial empty sella but no spinal cord abnormalities.  She underwent neuropsychological evaluation in July 2022, which demonstrated deficiencies in launguage, some aspects of executive functioning and visuospatial perception, meeting criteria for mild neurocognitive disorder.  Etiology unclear.  The report mentions that executive dysfunction and very mild retrieval-based memory difficulties may be related to nonspecific T2 hyperintensities in the frontal-subcortical region on MRI but also concern for early stages of PPA given language difficulties.Mild anxiety, sleep difficulties and some degree of somatization may be contributing factors.  MRI of brain with and without contrast on 08/30/2021 showed unchanged nonspecific chronic subcortical and periventricular white matter disease likely chronic small vessel ischemic changes.  For evaluation of vasculitis, SSA/SSB antibodies and ANCA panel were checked on 10/03/2021, which were normal.  Repeat neuropsychological evaluation on 11/27/2021 demonstrated mild cognitive impairment likely related to chronic pain, possibly topiramate and psychiatric stressors.  Her pain specialist tapered her off of topiramate and ***.    Past NSAIDS/analgesics:  none Past abortive triptans:  none Past abortive ergotamine:  none Past muscle relaxants:  none Past anti-emetic:  none Past antihypertensive medications:  none Past antidepressant medications:  Nortriptyline, Cymbalta Past anticonvulsant medications: topiramate (cognitive deficits) Past anti-CGRP:  none Past vitamins/Herbal/Supplements:  none Past antihistamines/decongestants:  none Other past therapies:  none  PAST MEDICAL HISTORY: Past Medical History:  Diagnosis Date   Abdominal pain 11/20/2011   Atypical facial pain 11/20/2011   Backache    Bulging lumbar disc 11/03/2014   Cardiac dysrhythmia 08/12/2011   Carpal tunnel syndrome 11/20/2011   Cholecystitis 06/16/2016   Diplopia 11/20/2011   Edema  11/20/2011   Elevated blood sugar 11/03/2014   Essential hypertension 08/12/2011   Fibromyalgia    Gastroesophageal reflux disease 11/04/2014   Gout 11/20/2011   Herpes simplex 11/20/2011   Iron deficiency anemia secondary to inadequate dietary iron intake 11/20/2011   Lytic lesion of bone on x-ray 05/17/2020   Major depressive disorder 11/14/2021   Malaise and fatigue 11/20/2011   Migraine headache    Mild cognitive impairment 11/27/2021   Morbid obesity with BMI of 60.0-69.9, adult 11/14/2021   Myalgia 11/20/2011   Nausea with vomiting 11/20/2011   Palpitations 08/30/2011   Pruritus of genitalia 11/20/2011   Shortness of breath 08/30/2011   URI (upper respiratory infection)     MEDICATIONS: Current Outpatient Medications on File Prior to Visit  Medication Sig Dispense Refill   albuterol (VENTOLIN HFA) 108 (90 Base) MCG/ACT inhaler INHALE 2 PUFFS BY MOUTH EVERY 4 TO 6 HOURS AS NEEDED FOR COUGH FOR WHEEZING FOR SHORTNESS OF BREATH     baclofen (LIORESAL) 10 MG tablet TAKE 1 TABLET(10 MG) BY MOUTH THREE TIMES DAILY (Patient not taking: Reported on 02/13/2022) 90 tablet 0   BD PEN NEEDLE NANO 2ND GEN 32G X 4 MM MISC FOR USE WITH VICTOZA ONCE A DAY ONCE DAILY (Patient not taking: Reported on 02/13/2022)     Erenumab-aooe (AIMOVIG) 140 MG/ML SOAJ Inject 140 mg into the skin every 28 (twenty-eight) days. 1.12 mL 5   ergocalciferol (VITAMIN D2) 1.25 MG (50000 UT) capsule      fluticasone (FLONASE) 50 MCG/ACT nasal spray Use 1 spray in each nostril 2 times daily for 5 days.     ibuprofen (ADVIL) 800 MG  tablet Take by mouth. As needed (Patient not taking: Reported on 02/13/2022)     loratadine (CLARITIN) 10 MG tablet TAKE 1 TABLET BY MOUTH ONCE DAILY AT NIGHT     montelukast (SINGULAIR) 10 MG tablet Take 10 mg by mouth at bedtime.     naltrexone (DEPADE) 50 MG tablet Take 1 tablet by mouth daily.     omeprazole (PRILOSEC) 20 MG capsule Take 20 mg by mouth daily as needed.     propranolol  (INDERAL) 80 MG tablet Take 80 mg by mouth daily.     tiZANidine (ZANAFLEX) 2 MG tablet Take 1 tab po BID as needed for muscle spasms; caution may cause sedation (Patient not taking: Reported on 02/13/2022)     topiramate ER (QUDEXY XR) 25 MG CS24 sprinkle cap TAKE 1 CAPSULE BY MOUTH AT BEDTIME FOR 1 WEEK THEN INCREASE TO 2 TABLETS BY MOUTH DAILY AS TOLERATED (Patient not taking: Reported on 02/13/2022)     valACYclovir (VALTREX) 1000 MG tablet Take 1,000 mg by mouth daily. (Patient not taking: Reported on 02/13/2022)     VICTOZA 18 MG/3ML SOPN Inject into the skin. (Patient not taking: Reported on 02/13/2022)     No current facility-administered medications on file prior to visit.    ALLERGIES: Allergies  Allergen Reactions   Latex Hives, Itching and Rash   Shellfish Allergy Anaphylaxis and Swelling   Wheat Rash    Other reaction(s): Abdominal Pain     Almond (Diagnostic) Swelling   Barley Grass Other (See Comments)    Upset stomach   Dairycare [Lactase-Lactobacillus] Diarrhea   Gramineae Pollens Itching    FAMILY HISTORY: Family History  Problem Relation Age of Onset   Hypertension Mother    Diabetes Father    Hyperlipidemia Father    Diabetes Sister    Dementia Maternal Grandmother    Birth defects Maternal Grandfather    Dementia Maternal Grandfather    Dementia Paternal Grandfather       Objective:  *** General: No acute distress.  Patient appears ***-groomed.   Head:  Normocephalic/atraumatic Eyes:  Fundi examined but not visualized Neck: supple, no paraspinal tenderness, full range of motion Heart:  Regular rate and rhythm Lungs:  Clear to auscultation bilaterally Back: No paraspinal tenderness Neurological Exam: alert and oriented to person, place, and time.  Speech fluent and not dysarthric, language intact.  CN II-XII intact. Bulk and tone normal, muscle strength 5/5 throughout.  Sensation to light touch intact.  Deep tendon reflexes 2+ throughout, toes  downgoing.  Finger to nose testing intact.  Gait normal, Romberg negative.   Metta Clines, DO  CC: ***

## 2022-07-15 ENCOUNTER — Ambulatory Visit: Payer: 59 | Admitting: Neurology

## 2022-10-23 ENCOUNTER — Other Ambulatory Visit: Payer: Self-pay | Admitting: Physician Assistant

## 2022-10-23 DIAGNOSIS — Z1231 Encounter for screening mammogram for malignant neoplasm of breast: Secondary | ICD-10-CM

## 2022-10-23 DIAGNOSIS — R1084 Generalized abdominal pain: Secondary | ICD-10-CM

## 2022-11-03 NOTE — Progress Notes (Unsigned)
NEUROLOGY FOLLOW UP OFFICE NOTE  Emily Ochoa [id number]  Assessment/Plan:   1  Migraine without aura and migraine with aura 2  Hypertension     As she has been experiencing elevated blood pressure, will plan to change from Aimovig to another CGRP inhibitor.  I have asked patient to contact her insurance to find out which of following are formulary:  Emgality, Ascencion Dike, Nurtec.  She will contact me once she finds out. Limit use of pain relievers to no more than 2 days out of week to prevent risk of rebound or medication-overuse headache. Keep headache diary Wear right wrist splint Follow up in 6 months.     Subjective:  Emily Ochoa is a 42 year old female with HTN, fibromyalgia and migraines who follows up for migraines and cognitive deficits   UPDATE: Started Aimovig Duration: 1 to 2 hours Frequency:  She was migraine-free for several months.  She started getting them every other day about 2-3 weeks ago.  She thinks it is due to elevated blood pressure.  Lately it has been 150-160/100.  She takes ibuprofen for 7 to 10 days every month during her period.     Current NSAIDS/analgesics:  ibuprofen (rarely) Current triptans:  none Current ergotamine:  none Current anti-emetic:  none Current muscle relaxants:  baclofen 10mg  TID PRN (leg cramps), tizanidine 4mg  BID PRN Current Antihypertensive medications:  propranolol ER 80mg  daily (higher doses effective but caused side effects - nausea) Current Antidepressant medications:  none Current Anticonvulsant medications:  none Current anti-CGRP:  Aimovig 140mg  Current Vitamins/Herbal/Supplements:  magnesium chloride, alpha-Brain, D3, folic acid, fish oil Current Antihistamines/Decongestants:  loratidine Other therapy:  none Hormone/birth control:  none Other medications:  none     HISTORY:  In 2022, she began experiencing new headaches.  No prior history of headaches.  She describes 5-6/10 pounding right parietal  headache with mild nausea and osmophobia but no vomiting, photophobia, phonophobia, visual disturbance, dizziness, unilateral numbness or weakness.  They last about 1-2 hours but occur three times daily.  No known triggers.  Laying down to rest helps.  Takes ibuprofen but no more than 2 days out of the week.     She also began having episodes of numbness and tingling.  Initially she experienced numbness and tingling of the entire head and body.  She was previously taking topiramate for fibromyalgia.  She stopped taking it but symptoms persisted.  She no longer has the full body numbness but now has episodes of right facial numbness and tingling associated with 5-6/10 throbbing right retroorbital pain and eye twitch.  Lasts off and on all day.  She has had about 5 or 6 episodes over the past year.     She started having balance issues, particularly when first standing up.  She would feel unsteady.  No dizziness.  This has overall improved.   She reports onset of memory problems in 2016, around the time she was diagnosed with fibromyalgia.  Specifically, she had brain fog and forgetfullness.  In early 2022, she began experiencing a steady cognitive decline.  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  She also reports difficulty remembering to take medication.  She started having difficulty completing chores, She sometimes mixes up words.  She has had episodes of slurred speech.     Labs performed in 2022 include sed rate 27 CRP 2, normal SPEP and TSH.  She takes oral B12.  B12 was  619.  MRI of brain and cervical spine without contrast on 09/23/2020 showed nonspecific multiple small T2 hyperintensities within the cerebral white matter as well as partial empty sella but no spinal cord abnormalities.  She underwent neuropsychological evaluation in July 2022, which demonstrated deficiencies in launguage, some aspects of executive functioning and visuospatial  perception, meeting criteria for mild neurocognitive disorder.  Etiology unclear.  The report mentions that executive dysfunction and very mild retrieval-based memory difficulties may be related to nonspecific T2 hyperintensities in the frontal-subcortical region on MRI but also concern for early stages of PPA given language difficulties.Mild anxiety, sleep difficulties and some degree of somatization may be contributing factors.  MRI of brain with and without contrast on 08/30/2021 showed unchanged nonspecific chronic subcortical and periventricular white matter disease likely chronic small vessel ischemic changes.  For evaluation of vasculitis, SSA/SSB antibodies and ANCA panel were checked on 10/03/2021, which were normal.  Repeat neuropsychological evaluation on 11/27/2021 demonstrated mild cognitive impairment likely related to chronic pain, possibly topiramate and psychiatric stressors.  She also endorsed paresthesias. NCV-EMG on 01/16/2022 shoed evidence of moderate right carpal tunnel syndrome at/distal to wrist but no evidence of large fiber sensorimotor polyneuropathy.     Past NSAIDS/analgesics:  none Past abortive triptans:  none Past abortive ergotamine:  none Past muscle relaxants:  none Past anti-emetic:  none Past antihypertensive medications:  none Past antidepressant medications:  Nortriptyline, Cymbalta Past anticonvulsant medications: topiramate (cognitive deficits) Past anti-CGRP:  none Past vitamins/Herbal/Supplements:  none Past antihistamines/decongestants:  none Other past therapies:  none  PAST MEDICAL HISTORY: Past Medical History:  Diagnosis Date   Abdominal pain 11/20/2011   Atypical facial pain 11/20/2011   Backache    Bulging lumbar disc 11/03/2014   Cardiac dysrhythmia 08/12/2011   Carpal tunnel syndrome 11/20/2011   Cholecystitis 06/16/2016   Diplopia 11/20/2011   Edema 11/20/2011   Elevated blood sugar 11/03/2014   Essential hypertension 08/12/2011    Fibromyalgia    Gastroesophageal reflux disease 11/04/2014   Gout 11/20/2011   Herpes simplex 11/20/2011   Iron deficiency anemia secondary to inadequate dietary iron intake 11/20/2011   Lytic lesion of bone on x-ray 05/17/2020   Major depressive disorder 11/14/2021   Malaise and fatigue 11/20/2011   Migraine headache    Mild cognitive impairment 11/27/2021   Morbid obesity with BMI of 60.0-69.9, adult 11/14/2021   Myalgia 11/20/2011   Nausea with vomiting 11/20/2011   Palpitations 08/30/2011   Pruritus of genitalia 11/20/2011   Shortness of breath 08/30/2011   URI (upper respiratory infection)     MEDICATIONS: Current Outpatient Medications on File Prior to Visit  Medication Sig Dispense Refill   albuterol (VENTOLIN HFA) 108 (90 Base) MCG/ACT inhaler INHALE 2 PUFFS BY MOUTH EVERY 4 TO 6 HOURS AS NEEDED FOR COUGH FOR WHEEZING FOR SHORTNESS OF BREATH     baclofen (LIORESAL) 10 MG tablet TAKE 1 TABLET(10 MG) BY MOUTH THREE TIMES DAILY (Patient not taking: Reported on 02/13/2022) 90 tablet 0   BD PEN NEEDLE NANO 2ND GEN 32G X 4 MM MISC FOR USE WITH VICTOZA ONCE A DAY ONCE DAILY (Patient not taking: Reported on 02/13/2022)     Erenumab-aooe (AIMOVIG) 140 MG/ML SOAJ Inject 140 mg into the skin every 28 (twenty-eight) days. 1.12 mL 5   ergocalciferol (VITAMIN D2) 1.25 MG (50000 UT) capsule      fluticasone (FLONASE) 50 MCG/ACT nasal spray Use 1 spray in each nostril 2 times daily for 5 days.     ibuprofen (ADVIL)  800 MG tablet Take by mouth. As needed (Patient not taking: Reported on 02/13/2022)     loratadine (CLARITIN) 10 MG tablet TAKE 1 TABLET BY MOUTH ONCE DAILY AT NIGHT     montelukast (SINGULAIR) 10 MG tablet Take 10 mg by mouth at bedtime.     naltrexone (DEPADE) 50 MG tablet Take 1 tablet by mouth daily.     omeprazole (PRILOSEC) 20 MG capsule Take 20 mg by mouth daily as needed.     propranolol (INDERAL) 80 MG tablet Take 80 mg by mouth daily.     tiZANidine (ZANAFLEX) 2 MG tablet  Take 1 tab po BID as needed for muscle spasms; caution may cause sedation (Patient not taking: Reported on 02/13/2022)     topiramate ER (QUDEXY XR) 25 MG CS24 sprinkle cap TAKE 1 CAPSULE BY MOUTH AT BEDTIME FOR 1 WEEK THEN INCREASE TO 2 TABLETS BY MOUTH DAILY AS TOLERATED (Patient not taking: Reported on 02/13/2022)     valACYclovir (VALTREX) 1000 MG tablet Take 1,000 mg by mouth daily. (Patient not taking: Reported on 02/13/2022)     VICTOZA 18 MG/3ML SOPN Inject into the skin. (Patient not taking: Reported on 02/13/2022)     No current facility-administered medications on file prior to visit.    ALLERGIES: Allergies  Allergen Reactions   Latex Hives, Itching and Rash   Shellfish Allergy Anaphylaxis and Swelling   Wheat Rash    Other reaction(s): Abdominal Pain     Almond (Diagnostic) Swelling   Barley Grass Other (See Comments)    Upset stomach   Dairycare [Lactase-Lactobacillus] Diarrhea   Gramineae Pollens Itching    FAMILY HISTORY: Family History  Problem Relation Age of Onset   Hypertension Mother    Diabetes Father    Hyperlipidemia Father    Diabetes Sister    Dementia Maternal Grandmother    Birth defects Maternal Grandfather    Dementia Maternal Grandfather    Dementia Paternal Grandfather       Objective:  Blood pressure 130/88, pulse 98, height 5\' 5"  (1.651 m), weight (!) 360 lb (163.3 kg), SpO2 98 %. General: No acute distress.  Patient appears well-groomed.   Head:  Normocephalic/atraumatic Neck:  Supple.  No paraspinal tenderness.  Full range of motion. Heart:  Regular rate and rhythm. Neuro:  Alert and oriented.  Speech fluent and not dysarthric.  Language intact.  CN II-XII intact.  Bulk and tone normal.  Muscle strength 5/5 throughout.  Deep tendon reflexes 2+ throughout.  Gait normal.  Romberg negative.   Shon Millet, DO

## 2022-11-04 ENCOUNTER — Encounter: Payer: Self-pay | Admitting: Neurology

## 2022-11-04 ENCOUNTER — Ambulatory Visit (INDEPENDENT_AMBULATORY_CARE_PROVIDER_SITE_OTHER): Payer: 59 | Admitting: Neurology

## 2022-11-04 VITALS — BP 130/88 | HR 98 | Ht 65.0 in | Wt 360.0 lb

## 2022-11-04 DIAGNOSIS — I10 Essential (primary) hypertension: Secondary | ICD-10-CM | POA: Diagnosis not present

## 2022-11-04 DIAGNOSIS — G43109 Migraine with aura, not intractable, without status migrainosus: Secondary | ICD-10-CM | POA: Diagnosis not present

## 2022-11-04 NOTE — Patient Instructions (Signed)
Due to elevated blood pressure, I want to stop Aimovig.  Please contact your insurance to find out which of the following medications are ON FORMULARY and immediately let me know. Emgality Ajovy Qulipta Nurtec every other day Limit use of pain relievers to no more than 2 days out of week to prevent risk of rebound or medication-overuse headache. Keep headache diary Follow up 6 months.

## 2022-11-11 ENCOUNTER — Encounter: Payer: Self-pay | Admitting: Neurology

## 2022-11-18 ENCOUNTER — Ambulatory Visit: Admission: RE | Admit: 2022-11-18 | Payer: 59 | Source: Ambulatory Visit

## 2022-11-18 DIAGNOSIS — R1084 Generalized abdominal pain: Secondary | ICD-10-CM

## 2022-11-18 MED ORDER — IOPAMIDOL (ISOVUE-300) INJECTION 61%
100.0000 mL | Freq: Once | INTRAVENOUS | Status: AC | PRN
  Administered 2022-11-18: 100 mL via INTRAVENOUS

## 2022-11-27 ENCOUNTER — Ambulatory Visit: Payer: 59

## 2022-11-28 ENCOUNTER — Ambulatory Visit
Admission: RE | Admit: 2022-11-28 | Discharge: 2022-11-28 | Disposition: A | Discharge status: Home / Self Care | Payer: 59 | Source: Ambulatory Visit | Attending: Physician Assistant | Admitting: Physician Assistant

## 2022-11-28 DIAGNOSIS — Z1231 Encounter for screening mammogram for malignant neoplasm of breast: Secondary | ICD-10-CM

## 2022-12-31 ENCOUNTER — Ambulatory Visit: Payer: 59 | Admitting: Neurology

## 2023-02-22 ENCOUNTER — Other Ambulatory Visit: Payer: Self-pay | Admitting: Neurology

## 2023-03-11 NOTE — Progress Notes (Signed)
[facility] - Clinic Note  03/24/2023   CHIEF COMPLAINT Patient presents for Retina Evaluation  HISTORY OF PRESENT ILLNESS: Emily Ochoa is a 42 y.o. female who presents to the clinic today for:  HPI     Retina Evaluation   In both eyes.  This started 1 month ago.  Duration of 1 month.  Associated Symptoms Floaters.  Context:  distance vision and near vision.        Comments   Retina eval per DR Almetta Lovely PA pt is reporting distance and near she has had floaters denies any flashes of light she is diabetic her last reading 90 2 weeks ago last A1C 1 month ago unsure number       Last edited by Etheleen Mayhew, COT on 03/24/2023  9:17 AM.     Patient is a new diabetic and isn't really having any vision issues.  Referring physician: No referring provider defined for this encounter.  HISTORICAL INFORMATION:  Selected notes from the MEDICAL RECORD NUMBER Referred by Almetta Lovely, PA-C for DM exam LEE:  Ocular Hx- PMH-   CURRENT MEDICATIONS: No current outpatient medications on file. (Ophthalmic Drugs)   No current facility-administered medications for this visit. (Ophthalmic Drugs)   Current Outpatient Medications (Other)  Medication Sig   albuterol (VENTOLIN HFA) 108 (90 Base) MCG/ACT inhaler INHALE 2 PUFFS BY MOUTH EVERY 4 TO 6 HOURS AS NEEDED FOR COUGH FOR WHEEZING FOR SHORTNESS OF BREATH   baclofen (LIORESAL) 10 MG tablet TAKE 1 TABLET(10 MG) BY MOUTH THREE TIMES DAILY   BD PEN NEEDLE NANO 2ND GEN 32G X 4 MM MISC    ergocalciferol (VITAMIN D2) 1.25 MG (50000 UT) capsule    fluticasone (FLONASE) 50 MCG/ACT nasal spray Use 1 spray in each nostril 2 times daily for 5 days.   ibuprofen (ADVIL) 800 MG tablet Take by mouth. As needed   loratadine (CLARITIN) 10 MG tablet TAKE 1 TABLET BY MOUTH ONCE DAILY AT NIGHT   montelukast (SINGULAIR) 10 MG tablet Take 10 mg by mouth at bedtime.   naltrexone (DEPADE) 50 MG tablet Take 1 tablet by  mouth daily.   propranolol (INDERAL) 80 MG tablet Take 80 mg by mouth daily.   tiZANidine (ZANAFLEX) 2 MG tablet    valACYclovir (VALTREX) 1000 MG tablet Take 1,000 mg by mouth daily.   VICTOZA 18 MG/3ML SOPN Inject into the skin. (Patient not taking: Reported on 03/24/2023)   No current facility-administered medications for this visit. (Other)   REVIEW OF SYSTEMS: ROS   Positive for: Neurological, Endocrine, Cardiovascular, Eyes, Allergic/Imm Negative for: Psychiatric Last edited by Etheleen Mayhew, COT on 03/24/2023  9:17 AM.     ALLERGIES Allergies  Allergen Reactions   Latex Hives, Itching and Rash   Shellfish Allergy Anaphylaxis and Swelling   Wheat Rash    Other reaction(s): Abdominal Pain     Almond (Diagnostic) Swelling   Barley Grass Other (See Comments)    Upset stomach   Dairycare [Bacid] Diarrhea   Gramineae Pollens Itching   PAST MEDICAL HISTORY Past Medical History:  Diagnosis Date   Abdominal pain 11/20/2011   Atypical facial pain 11/20/2011   Backache    Bulging lumbar disc 11/03/2014   Cardiac dysrhythmia 08/12/2011   Carpal tunnel syndrome 11/20/2011   Cholecystitis 06/16/2016   Diplopia 11/20/2011   Edema 11/20/2011   Elevated blood sugar 11/03/2014   Essential hypertension 08/12/2011   Fibromyalgia    Gastroesophageal reflux disease 11/04/2014  Gout 11/20/2011   Herpes simplex 11/20/2011   Iron deficiency anemia secondary to inadequate dietary iron intake 11/20/2011   Lytic lesion of bone on x-ray 05/17/2020   Major depressive disorder 11/14/2021   Malaise and fatigue 11/20/2011   Migraine headache    Mild cognitive impairment 11/27/2021   Morbid obesity with BMI of 60.0-69.9, adult 11/14/2021   Myalgia 11/20/2011   Nausea with vomiting 11/20/2011   Palpitations 08/30/2011   Pruritus of genitalia 11/20/2011   Shortness of breath 08/30/2011   URI (upper respiratory infection)    Past Surgical History:  Procedure Laterality Date    BACK SURGERY     bioposy in 2009   CHOLECYSTECTOMY     FAMILY HISTORY Family History  Problem Relation Age of Onset   Hypertension Mother    Diabetes Father    Hyperlipidemia Father    Breast cancer Sister 75   Diabetes Sister    Dementia Maternal Grandmother    Birth defects Maternal Grandfather    Dementia Maternal Grandfather    Dementia Paternal Grandfather    SOCIAL HISTORY Social History   Tobacco Use   Smoking status: Never   Smokeless tobacco: Never  Substance Use Topics   Alcohol use: No   Drug use: No       OPHTHALMIC EXAM:  Base Eye Exam     Visual Acuity (Snellen - Linear)       Right Left   Dist [place] 20/20 20/20         Tonometry (Tonopen, 9:22 AM)       Right Left   Pressure 11 14         Pupils       Pupils Dark Light Shape React APD   Right PERRL 5 3 Round Brisk None   Left PERRL 5 3 Round Brisk None         Visual Fields       Left Right    Full Full         Extraocular Movement       Right Left    Full, Ortho Full, Ortho         Neuro/Psych     Oriented x3: Yes   Mood/Affect: Normal         Dilation     Both eyes: 2.5% Phenylephrine @ 9:22 AM           Slit Lamp and Fundus Exam     External Exam       Right Left   External Normal Normal         Slit Lamp Exam       Right Left   Lids/Lashes Normal Normal   Conjunctiva/Sclera Melanosis Melanosis   Cornea Mild Debris in tear film Mild Debris in tear film   Anterior Chamber Deep and clear Deep and clear   Iris Round and dilated, No NVI Round and dilated, No NVI   Lens Trace cortical changes Trace cortical changes   Anterior Vitreous Vitreous syneresis Vitreous syneresis         Fundus Exam       Right Left   Disc Pink and sharp Pink and sharp   C/D Ratio 0.4 0.3   Macula Flat, Good foveal reflex, No heme or edema Flat, Good foveal reflex, No heme or edema   Vessels Mild Tortuous Mild Tortuous   Periphery Attached, temporal white  without pressure, No heme Attached, No heme  IMAGING AND PROCEDURES  Imaging and Procedures for 03/24/2023  OCT, Retina - OU - Both Eyes       Right Eye Quality was good. Central Foveal Thickness: 246. Progression has no prior data. Findings include normal foveal contour, no IRF, no SRF, vitreomacular adhesion .   Left Eye Quality was good. Central Foveal Thickness: 249. Progression has no prior data. Findings include normal foveal contour, no IRF, no SRF.   Notes *Images captured and stored on drive  Diagnosis / Impression:  OU: no DME  Clinical management:  See below  Abbreviations: NFP - Normal foveal profile. CME - cystoid macular edema. PED - pigment epithelial detachment. IRF - intraretinal fluid. SRF - subretinal fluid. EZ - ellipsoid zone. ERM - epiretinal membrane. ORA - outer retinal atrophy. ORT - outer retinal tubulation. SRHM - subretinal hyper-reflective material. IRHM - intraretinal hyper-reflective material           ASSESSMENT/PLAN:   ICD-10-CM   1. Diabetes mellitus without complication (HCC)  E11.9 OCT, Retina - OU - Both Eyes    2. Essential hypertension  I10     3. Hypertensive retinopathy of both eyes  H35.033      Diabetes mellitus, type 2 without retinopathy  - A1C 6.7 (06.25.24)  - Diagnosed with Type 2 diabetes about 4 months ago (July 2024)  - Not on Diabetic medication at this time. - The incidence, risk factors for progression, natural history and treatment options for diabetic retinopathy  were discussed with patient.   - The need for close monitoring of blood glucose, blood pressure, and serum lipids, avoiding cigarette or any type of tobacco, and the need for long term follow up was also discussed with patient. - OCT shows  - f/u in 1 year, sooner prn   2. Hypertensive retinopathy OU - discussed importance of tight BP control - monitor   Ophthalmic Meds Ordered this visit:  No orders of the defined types were placed in  this encounter.    Return in about 1 year (around 03/23/2024) for f/u DM exam , DFE, OCT.  There are no Patient Instructions on file for this visit.  Explained the diagnoses, plan, and follow up with the patient and they expressed understanding.  Patient expressed understanding of the importance of proper follow up care.   This document serves as a record of services personally performed by Karie Chimera, MD, PhD. It was created on their behalf by Glee Arvin. Manson Passey, OA an ophthalmic technician. The creation of this record is the provider's dictation and/or activities during the visit.    Electronically signed by: Glee Arvin. Manson Passey, OA 03/24/23 10:20 AM  This document serves as a record of services personally performed by Karie Chimera, MD, PhD. It was created on their behalf by Charlette Caffey, COT an ophthalmic technician. The creation of this record is the provider's dictation and/or activities during the visit.    Electronically signed by:  Charlette Caffey, COT  03/24/23 10:20 AM  Karie Chimera, M.D., Ph.D. Diseases & Surgery of the Retina and Vitreous [facility] 03/24/2023  Abbreviations: M myopia (nearsighted); A astigmatism; H hyperopia (farsighted); P presbyopia; Mrx spectacle prescription;  CTL contact lenses; OD right eye; OS left eye; OU both eyes  XT exotropia; ET esotropia; PEK punctate epithelial keratitis; PEE punctate epithelial erosions; DES dry eye syndrome; MGD meibomian gland dysfunction; ATs artificial tears; PFAT's preservative free artificial tears; NSC nuclear sclerotic cataract; PSC posterior subcapsular  cataract; ERM epi-retinal membrane; PVD posterior vitreous detachment; RD retinal detachment; DM diabetes mellitus; DR diabetic retinopathy; NPDR non-proliferative diabetic retinopathy; PDR proliferative diabetic retinopathy; CSME clinically significant macular edema; DME diabetic macular edema; dbh dot blot hemorrhages; CWS cotton wool  spot; POAG primary open angle glaucoma; C/D cup-to-disc ratio; HVF humphrey visual field; GVF goldmann visual field; OCT optical coherence tomography; IOP intraocular pressure; BRVO Branch retinal vein occlusion; CRVO central retinal vein occlusion; CRAO central retinal artery occlusion; BRAO branch retinal artery occlusion; RT retinal tear; SB scleral buckle; PPV pars plana vitrectomy; VH Vitreous hemorrhage; PRP panretinal laser photocoagulation; IVK intravitreal kenalog; VMT vitreomacular traction; MH Macular hole;  NVD neovascularization of the disc; NVE neovascularization elsewhere; AREDS age related eye disease study; ARMD age related macular degeneration; POAG primary open angle glaucoma; EBMD epithelial/anterior basement membrane dystrophy; ACIOL anterior chamber intraocular lens; IOL intraocular lens; PCIOL posterior chamber intraocular lens; Phaco/IOL phacoemulsification with intraocular lens placement; PRK photorefractive keratectomy; LASIK laser assisted in situ keratomileusis; HTN hypertension; DM diabetes mellitus; COPD chronic obstructive pulmonary disease

## 2023-03-24 ENCOUNTER — Ambulatory Visit (INDEPENDENT_AMBULATORY_CARE_PROVIDER_SITE_OTHER): Payer: Medicare Other | Admitting: Ophthalmology

## 2023-03-24 ENCOUNTER — Encounter (INDEPENDENT_AMBULATORY_CARE_PROVIDER_SITE_OTHER): Payer: Self-pay | Admitting: Ophthalmology

## 2023-03-24 DIAGNOSIS — H35033 Hypertensive retinopathy, bilateral: Secondary | ICD-10-CM

## 2023-03-24 DIAGNOSIS — H3581 Retinal edema: Secondary | ICD-10-CM

## 2023-03-24 DIAGNOSIS — I10 Essential (primary) hypertension: Secondary | ICD-10-CM | POA: Diagnosis not present

## 2023-03-24 DIAGNOSIS — E119 Type 2 diabetes mellitus without complications: Secondary | ICD-10-CM | POA: Diagnosis not present

## 2023-03-25 ENCOUNTER — Encounter (INDEPENDENT_AMBULATORY_CARE_PROVIDER_SITE_OTHER): Payer: Self-pay | Admitting: Ophthalmology

## 2023-05-07 ENCOUNTER — Ambulatory Visit: Payer: 59 | Admitting: Neurology

## 2023-05-20 NOTE — Progress Notes (Deleted)
NEUROLOGY FOLLOW UP OFFICE NOTE  Emily Ochoa [id number]  Assessment/Plan:   1  Migraine without aura and migraine with aura 2  Hypertension     As she has been experiencing elevated blood pressure, will plan to change from Aimovig to another CGRP inhibitor.  I have asked patient to contact her insurance to find out which of following are formulary:  [employer], [employer], [employer].  She will contact me once she finds out. Limit use of pain relievers to no more than 2 days out of week to prevent risk of rebound or medication-overuse headache. Keep headache diary Wear right wrist splint Follow up in 6 months.     Subjective:  Emily Ochoa is a 43 year old female with HTN, fibromyalgia and migraines who follows up for migraines and cognitive deficits   UPDATE: Started Aimovig Duration: 1 to 2 hours Frequency:  *** HTN ***   Current NSAIDS/analgesics:  ibuprofen (rarely) Current triptans:  none Current ergotamine:  none Current anti-emetic:  none Current muscle relaxants:  baclofen 10mg  TID PRN (leg cramps), tizanidine 4mg  BID PRN Current Antihypertensive medications:  propranolol ER 80mg  daily (higher doses effective but caused side effects - nausea) Current Antidepressant medications:  none Current Anticonvulsant medications:  none Current anti-CGRP:  Aimovig 140mg  Current Vitamins/Herbal/Supplements:  magnesium chloride, alpha-Brain, D3, folic acid, fish oil Current Antihistamines/Decongestants:  loratidine Other therapy:  none Hormone/birth control:  none Other medications:  none     HISTORY:  In 2022, she began experiencing new headaches.  No prior history of headaches.  She describes 5-6/10 pounding right parietal headache with mild nausea and osmophobia but no vomiting, photophobia, phonophobia, visual disturbance, dizziness, unilateral numbness or weakness.  They last about 1-2 hours but occur three times daily.  No known triggers.  Laying down to rest  helps.  Takes ibuprofen but no more than 2 days out of the week.     She also began having episodes of numbness and tingling.  Initially she experienced numbness and tingling of the entire head and body.  She was previously taking topiramate for fibromyalgia.  She stopped taking it but symptoms persisted.  She no longer has the full body numbness but now has episodes of right facial numbness and tingling associated with 5-6/10 throbbing right retroorbital pain and eye twitch.  Lasts off and on all day.  She has had about 5 or 6 episodes over the past year.     She started having balance issues, particularly when first standing up.  She would feel unsteady.  No dizziness.  This has overall improved.   She reports onset of memory problems in 2016, around the time she was diagnosed with fibromyalgia.  Specifically, she had brain fog and forgetfullness.  In early 2022, she began experiencing a steady cognitive decline.  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  She also reports difficulty remembering to take medication.  She started having difficulty completing chores, She sometimes mixes up words.  She has had episodes of slurred speech.     Labs performed in 2022 include sed rate 27 CRP 2, normal SPEP and TSH.  She takes oral B12.  B12 was 619.  MRI of brain and cervical spine without contrast on 09/23/2020 showed nonspecific multiple small T2 hyperintensities within the cerebral white matter as well as partial empty sella but no spinal cord abnormalities.  She underwent neuropsychological evaluation in July 2022, which demonstrated deficiencies in [place],  some aspects of executive functioning and visuospatial perception, meeting criteria for mild neurocognitive disorder.  Etiology unclear.  The report mentions that executive dysfunction and very mild retrieval-based memory difficulties may be related to nonspecific T2 hyperintensities in the  frontal-subcortical region on MRI but also concern for early stages of PPA given language difficulties.Mild anxiety, sleep difficulties and some degree of somatization may be contributing factors.  MRI of brain with and without contrast on 08/30/2021 showed unchanged nonspecific chronic subcortical and periventricular white matter disease likely chronic small vessel ischemic changes.  For evaluation of vasculitis, SSA/SSB antibodies and ANCA panel were checked on 10/03/2021, which were normal.  Repeat neuropsychological evaluation on 11/27/2021 demonstrated mild cognitive impairment likely related to chronic pain, possibly topiramate and psychiatric stressors.  She also endorsed paresthesias. NCV-EMG on 01/16/2022 shoed evidence of moderate right carpal tunnel syndrome at/distal to wrist but no evidence of large fiber sensorimotor polyneuropathy.     Past NSAIDS/analgesics:  none Past abortive triptans:  none Past abortive ergotamine:  none Past muscle relaxants:  none Past anti-emetic:  none Past antihypertensive medications:  none Past antidepressant medications:  Nortriptyline, Cymbalta Past anticonvulsant medications: topiramate (cognitive deficits) Past anti-CGRP:  none Past vitamins/Herbal/Supplements:  none Past antihistamines/decongestants:  none Other past therapies:  none  PAST MEDICAL HISTORY: Past Medical History:  Diagnosis Date   Abdominal pain 11/20/2011   Atypical facial pain 11/20/2011   Backache    Bulging lumbar disc 11/03/2014   Cardiac dysrhythmia 08/12/2011   Carpal tunnel syndrome 11/20/2011   Cholecystitis 06/16/2016   Diplopia 11/20/2011   Edema 11/20/2011   Elevated blood sugar 11/03/2014   Essential hypertension 08/12/2011   Fibromyalgia    Gastroesophageal reflux disease 11/04/2014   Gout 11/20/2011   Herpes simplex 11/20/2011   Iron deficiency anemia secondary to inadequate dietary iron intake 11/20/2011   Lytic lesion of bone on x-ray 05/17/2020   Major  depressive disorder 11/14/2021   Malaise and fatigue 11/20/2011   Migraine headache    Mild cognitive impairment 11/27/2021   Morbid obesity with BMI of 60.0-69.9, adult 11/14/2021   Myalgia 11/20/2011   Nausea with vomiting 11/20/2011   Palpitations 08/30/2011   Pruritus of genitalia 11/20/2011   Shortness of breath 08/30/2011   URI (upper respiratory infection)     MEDICATIONS: Current Outpatient Medications on File Prior to Visit  Medication Sig Dispense Refill   albuterol (VENTOLIN HFA) 108 (90 Base) MCG/ACT inhaler INHALE 2 PUFFS BY MOUTH EVERY 4 TO 6 HOURS AS NEEDED FOR COUGH FOR WHEEZING FOR SHORTNESS OF BREATH     baclofen (LIORESAL) 10 MG tablet TAKE 1 TABLET(10 MG) BY MOUTH THREE TIMES DAILY 90 tablet 0   BD PEN NEEDLE NANO 2ND GEN 32G X 4 MM MISC      ergocalciferol (VITAMIN D2) 1.25 MG (50000 UT) capsule      fluticasone (FLONASE) 50 MCG/ACT nasal spray Use 1 spray in each nostril 2 times daily for 5 days.     ibuprofen (ADVIL) 800 MG tablet Take by mouth. As needed     loratadine (CLARITIN) 10 MG tablet TAKE 1 TABLET BY MOUTH ONCE DAILY AT NIGHT     montelukast (SINGULAIR) 10 MG tablet Take 10 mg by mouth at bedtime.     naltrexone (DEPADE) 50 MG tablet Take 1 tablet by mouth daily.     propranolol (INDERAL) 80 MG tablet Take 80 mg by mouth daily.     tiZANidine (ZANAFLEX) 2 MG tablet      valACYclovir (  VALTREX) 1000 MG tablet Take 1,000 mg by mouth daily.     VICTOZA 18 MG/3ML SOPN Inject into the skin. (Patient not taking: Reported on 03/24/2023)     No current facility-administered medications on file prior to visit.    ALLERGIES: Allergies  Allergen Reactions   Latex Hives, Itching and Rash   Shellfish Allergy Anaphylaxis and Swelling   Wheat Rash    Other reaction(s): Abdominal Pain     Almond (Diagnostic) Swelling   Barley Grass Other (See Comments)    Upset stomach   Dairycare [Bacid] Diarrhea   Gramineae Pollens Itching    FAMILY HISTORY: Family  History  Problem Relation Age of Onset   Hypertension Mother    Diabetes Father    Hyperlipidemia Father    Breast cancer Sister 57   Diabetes Sister    Dementia Maternal Grandmother    Birth defects Maternal Grandfather    Dementia Maternal Grandfather    Dementia Paternal Grandfather       Objective:  *** General: No acute distress.  Patient appears well-groomed.   ***   Shon Millet, DO   CC: Almetta Lovely, PA-C ***

## 2023-05-21 ENCOUNTER — Ambulatory Visit: Payer: 59 | Admitting: Neurology

## 2023-06-29 ENCOUNTER — Other Ambulatory Visit: Payer: Self-pay

## 2023-06-29 ENCOUNTER — Encounter (HOSPITAL_BASED_OUTPATIENT_CLINIC_OR_DEPARTMENT_OTHER): Payer: Self-pay | Admitting: Emergency Medicine

## 2023-06-29 ENCOUNTER — Emergency Department (HOSPITAL_BASED_OUTPATIENT_CLINIC_OR_DEPARTMENT_OTHER)
Admission: EM | Admit: 2023-06-29 | Discharge: 2023-06-29 | Disposition: A | Discharge status: Home / Self Care | Attending: Emergency Medicine | Admitting: Emergency Medicine

## 2023-06-29 DIAGNOSIS — Z0130 Encounter for examination of blood pressure without abnormal findings: Secondary | ICD-10-CM | POA: Insufficient documentation

## 2023-06-29 DIAGNOSIS — R42 Dizziness and giddiness: Secondary | ICD-10-CM | POA: Diagnosis present

## 2023-06-29 NOTE — Discharge Instructions (Signed)
 Continue to check your blood pressure at home and document.  Please return if you have severe symptoms as we discussed including worst headache, stroke symptoms, severe chest pain.  Follow-up closely with your primary care doctor.

## 2023-06-29 NOTE — ED Notes (Signed)
 Pt given discharge instructions. Opportunities given for questions. Pt verbalizes understanding. Pt to follow-up with PCP.  Jillyn Hidden, RN

## 2023-06-29 NOTE — ED Triage Notes (Signed)
 Pt arrived with c/o hypertension. Recently changed BP meds at PCP. Blurry vision at times and has been monitoring BP at home.

## 2023-06-29 NOTE — ED Provider Notes (Signed)
 [place] EMERGENCY DEPARTMENT AT [facility] Provider Note   CSN: [id number] Arrival date & time: 06/29/23  4098     History  Chief Complaint  Patient presents with   Hypertension    Emily Ochoa is a 43 y.o. female.  Patient here due to elevated blood pressure.  She is having blood pressure of 150 systolic at home.  Recently increased her propranolol which she takes for her blood pressure.  She has a history of migraines fibromyalgia.  She is not having any headaches chest pain stroke symptoms.  Is not having any blurred vision although sometimes she feels that way.  She was having some lightheaded episodes a few weeks back and they made adjustments to her medications and she is feeling better from that standpoint.  She felt like she can get her blood pressure under control last night despite taking her propranolol.  She is feeling little bit better now that her blood pressure is improved in triage.  The history is provided by the patient.       Home Medications Prior to Admission medications   Medication Sig Start Date End Date Taking? Authorizing Provider  albuterol (VENTOLIN HFA) 108 (90 Base) MCG/ACT inhaler INHALE 2 PUFFS BY MOUTH EVERY 4 TO 6 HOURS AS NEEDED FOR COUGH FOR WHEEZING FOR SHORTNESS OF BREATH 04/02/19   [provider]  baclofen (LIORESAL) 10 MG tablet TAKE 1 TABLET(10 MG) BY MOUTH THREE TIMES DAILY 09/28/21   Drema Dallas, DO  BD PEN NEEDLE NANO 2ND GEN 32G X 4 MM MISC  08/15/21   [provider]  ergocalciferol (VITAMIN D2) 1.25 MG (50000 UT) capsule  03/15/19   [provider]  fluticasone (FLONASE) 50 MCG/ACT nasal spray Use 1 spray in each nostril 2 times daily for 5 days. 01/15/21   [provider]  ibuprofen (ADVIL) 800 MG tablet Take by mouth. As needed 12/19/20   [provider]  loratadine (CLARITIN) 10 MG tablet TAKE 1 TABLET BY MOUTH ONCE DAILY AT NIGHT 03/22/19   [provider]   montelukast (SINGULAIR) 10 MG tablet Take 10 mg by mouth at bedtime. 08/13/21   [provider]  naltrexone (DEPADE) 50 MG tablet Take 1 tablet by mouth daily.    [provider]  propranolol (INDERAL) 80 MG tablet Take 80 mg by mouth daily.    [provider]  tiZANidine (ZANAFLEX) 2 MG tablet  05/03/20   [provider]  valACYclovir (VALTREX) 1000 MG tablet Take 1,000 mg by mouth daily. 06/28/21   [provider]  VICTOZA 18 MG/3ML SOPN Inject into the skin. Patient not taking: Reported on 03/24/2023 08/13/21   [provider]      Allergies    Latex, Shellfish allergy, Wheat, Almond (diagnostic), Barley grass, Dairycare [bacid], and Gramineae pollens    Review of Systems   Review of Systems  Physical Exam Updated Vital Signs BP 117/79   Resp 18   Ht 5\' 3"  (1.6 m)   Wt (!) 149.7 kg   LMP 06/16/2023   BMI 58.46 kg/m  Physical Exam Vitals and nursing note reviewed.  Constitutional:      General: She is not in acute distress.    Appearance: She is well-developed. She is not ill-appearing.  HENT:     Head: Normocephalic and atraumatic.     Mouth/Throat:     Mouth: Mucous membranes are moist.  Eyes:     Extraocular Movements: Extraocular movements intact.  Conjunctiva/sclera: Conjunctivae normal.     Pupils: Pupils are equal, round, and reactive to light.  Cardiovascular:     Rate and Rhythm: Normal rate and regular rhythm.     Pulses: Normal pulses.     Heart sounds: Normal heart sounds. No murmur heard. Pulmonary:     Effort: Pulmonary effort is normal. No respiratory distress.     Breath sounds: Normal breath sounds.  Abdominal:     Palpations: Abdomen is soft.     Tenderness: There is no abdominal tenderness.  Musculoskeletal:        General: No swelling.     Cervical back: Normal range of motion and neck supple.  Skin:    General: Skin is warm and dry.     Capillary Refill: Capillary refill takes less than 2  seconds.  Neurological:     General: No focal deficit present.     Mental Status: She is alert and oriented to person, place, and time.     Cranial Nerves: No cranial nerve deficit.     Sensory: No sensory deficit.     Motor: No weakness.     Coordination: Coordination normal.     Comments: 5+ out of 5 strength throughout, normal sensation, no drift, normal finger-to-nose finger, normal speech  Psychiatric:        Mood and Affect: Mood normal.     ED Results / Procedures / Treatments   Labs (all labs ordered are listed, but only abnormal results are displayed) Labs Reviewed - No data to display  EKG None  Radiology No results found.  Procedures Procedures    Medications Ordered in ED Medications - No data to display  ED Course/ Medical Decision Making/ A&P                                 Medical Decision Making  Emily Ochoa is here for evaluation of her blood pressure.  Her blood pressure in triage was 142/91 and then after talking with her for a while her repeat blood pressure was 117/79.  She is asymptomatic.  No headache chest pain or stroke symptoms.  She is neurologically intact.  She is very well-appearing.  They recently increased her propranolol 2 weeks ago for her blood pressure.  She has a history of migraines fibromyalgia.  She is always under some sort of discomfort but no new issues at this time otherwise.  She states that she has been having a hard time  checking her blood pressure as large cuffs do not fit very well around her upper arm so she has been trying to use it at her wrist which I think is probably the best spot for her.  As well we checked her blood pressure today and recommend that she check going forward.  She has follow-up appointment with her primary care doctor in about 10 days to reevaluate her blood pressure.  I think it is important for her to keep documenting her blood pressures and bring in her blood pressure cuff with her to that visit.   Right now I do not think she is having any signs of endorgan damage.  She is well-appearing asymptomatic given reassurance and understands return precautions.  Discharged in good condition.  This chart was dictated using voice recognition software.  Despite best efforts to proofread,  errors can occur which can change the documentation meaning.  Final Clinical Impression(s) / ED Diagnoses Final diagnoses:  Visit for blood pressure examination    Rx / DC Orders ED Discharge Orders     None         Virgina Norfolk, DO 06/29/23 [phone]

## 2023-12-17 ENCOUNTER — Other Ambulatory Visit: Payer: Self-pay | Admitting: Physician Assistant

## 2023-12-17 DIAGNOSIS — Z1231 Encounter for screening mammogram for malignant neoplasm of breast: Secondary | ICD-10-CM

## 2024-01-07 ENCOUNTER — Ambulatory Visit (INDEPENDENT_AMBULATORY_CARE_PROVIDER_SITE_OTHER)

## 2024-01-07 ENCOUNTER — Ambulatory Visit: Admitting: Podiatry

## 2024-01-07 ENCOUNTER — Encounter: Payer: Self-pay | Admitting: Podiatry

## 2024-01-07 VITALS — Ht 64.0 in | Wt 256.0 lb

## 2024-01-07 DIAGNOSIS — M7752 Other enthesopathy of left foot: Secondary | ICD-10-CM | POA: Diagnosis not present

## 2024-01-07 DIAGNOSIS — M7662 Achilles tendinitis, left leg: Secondary | ICD-10-CM | POA: Diagnosis not present

## 2024-01-07 MED ORDER — DICLOFENAC SODIUM 75 MG PO TBEC: 75.0000 mg | DELAYED_RELEASE_TABLET | Freq: Two times a day (BID) | ORAL | 0 refills | Status: DC

## 2024-01-07 NOTE — Progress Notes (Unsigned)
 Chief Complaint  Patient presents with   Foot Pain    L plantar fascitis 3 years ago had it under control with inserts lost weight and started having pain 7 heel plantar up into achilles 2 weeks ago . Type 2 diabetes.  No anti coag    HPI: 43 y.o. female presents today with left heel pain.  Reports it is to the back of the heel.  She endorses history of diabetes, has had a blood sugar under pretty good control.  She is obese but has lost a lot of weight and tries to be active.  She denies any specific injury to the area.  Has dealt with plantar fasciitis left foot previously.  Past Medical History:  Diagnosis Date   Abdominal pain 11/20/2011   Atypical facial pain 11/20/2011   Backache    Bulging lumbar disc 11/03/2014   Cardiac dysrhythmia 08/12/2011   Carpal tunnel syndrome 11/20/2011   Cholecystitis 06/16/2016   Diplopia 11/20/2011   Edema 11/20/2011   Elevated blood sugar 11/03/2014   Essential hypertension 08/12/2011   Fibromyalgia    Gastroesophageal reflux disease 11/04/2014   Gout 11/20/2011   Herpes simplex 11/20/2011   Iron deficiency anemia secondary to inadequate dietary iron intake 11/20/2011   Lytic lesion of bone on x-ray 05/17/2020   Major depressive disorder 11/14/2021   Malaise and fatigue 11/20/2011   Migraine headache    Mild cognitive impairment 11/27/2021   Morbid obesity with BMI of 60.0-69.9, adult 11/14/2021   Myalgia 11/20/2011   Nausea with vomiting 11/20/2011   Palpitations 08/30/2011   Pruritus of genitalia 11/20/2011   Shortness of breath 08/30/2011   URI (upper respiratory infection)     Past Surgical History:  Procedure Laterality Date   BACK SURGERY     bioposy in 2009   CHOLECYSTECTOMY      Allergies  Allergen Reactions   Latex Hives, Itching and Rash   Shellfish Allergy Anaphylaxis and Swelling   Wheat Rash    Other reaction(s): Abdominal Pain     Almond (Diagnostic) Swelling   Barley Grass Other (See Comments)     Upset stomach   Dairycare [Bacid] Diarrhea   Gramineae Pollens Itching    ROS    Physical Exam: There were no vitals filed for this visit.  General: The patient is alert and oriented x3 in no acute distress.  Dermatology: Skin is warm, dry and supple bilateral lower extremities. Interspaces are clear of maceration and debris.    Vascular: Palpable pedal pulses bilaterally. Capillary refill within normal limits.  No appreciable edema.  No erythema or calor.  Neurological: Light touch sensation grossly intact bilateral feet.   Musculoskeletal Exam: Muscle strength 5/5 all major muscle groups.  Pes planus foot type.  Tenderness on palpation of left posterior heel at level of Achilles insertion.  No Haglund's deformity.  Localized inflammation present.  No tendon thickening.  No palpable dell.  No tenderness on palpation of Achilles and watershed area.  No pain on palpation of heel at the plantar medial or plantar central aspect.  Radiographic Exam: Left foot 3 views weightbearing 01/07/2024 Normal osseous mineralization. Joint spaces preserved.  Pes planus foot type.  Spur formation present posterior heel.  Assessment/Plan of Care: 1. Calcific Achilles tendinitis of left lower extremity      Meds ordered this encounter  Medications   diclofenac  (VOLTAREN ) 75 MG EC tablet    Sig: Take 1 tablet (75 mg total) by mouth 2 (  two) times daily.    Dispense:  30 tablet    Refill:  0   None  Discussed clinical findings with patient today.  Radiographs reviewed with patient  Discussed the etiology and treatment options for left achilles tendinitis.  We discussed that these types of injuries are overuse injuries and respond well to anti-inflammatories, rest, immobilization.  I reviewed RICE protocol with the patient.  Recommend the following treatment plan:  -Immobilization with Cam boot with felt heel lifts -Oral anti-inflammatories prescribed: Course of oral diclofenac  -Recommended topical  diclofenac  gel 1% to apply daily 3-4 times on the painful areas -Recommended twice daily stretching through these exercises reviewed with the patient and an informational handout was given to the patient.  Reevaluate in about 4 weeks.    Niles Ess L. Lamount MAUL, AACFAS [facility]     [address] [place], [place] [address]                Office [phone]  Fax [phone]

## 2024-01-07 NOTE — Patient Instructions (Signed)
 Achilles Tendinitis  with Rehab Achilles tendinitis is a disorder of the Achilles tendon. The Achilles tendon connects the large calf muscles (Gastrocnemius and Soleus) to the heel bone (calcaneus). This tendon is sometimes called the heel cord. It is important for pushing-off and standing on your toes and is important for walking, running, or jumping. Tendinitis is often caused by overuse and repetitive microtrauma. SYMPTOMS Pain, tenderness, swelling, warmth, and redness may occur over the Achilles tendon even at rest. Pain with pushing off, or flexing or extending the ankle. Pain that is worsened after or during activity. CAUSES  Overuse sometimes seen with rapid increase in exercise programs or in sports requiring running and jumping. Poor physical conditioning (strength and flexibility or endurance). Running sports, especially training running down hills. Inadequate warm-up before practice or play or failure to stretch before participation. Injury to the tendon. PREVENTION  Warm up and stretch before practice or competition. Allow time for adequate rest and recovery between practices and competition. Keep up conditioning. Keep up ankle and leg flexibility. Improve or keep muscle strength and endurance. Improve cardiovascular fitness. Use proper technique. Use proper equipment (shoes, skates). To help prevent recurrence, taping, protective strapping, or an adhesive bandage may be recommended for several weeks after healing is complete. PROGNOSIS  Recovery may take weeks to several months to heal. Longer recovery is expected if symptoms have been prolonged. Recovery is usually quicker if the inflammation is due to a direct blow as compared with overuse or sudden strain. RELATED COMPLICATIONS  Healing time will be prolonged if the condition is not correctly treated. The injury must be given plenty of time to heal. Symptoms can reoccur if activity is resumed too soon. Untreated,  tendinitis may increase the risk of tendon rupture requiring additional time for recovery and possibly surgery. TREATMENT  The first treatment consists of rest anti-inflammatory medication, and ice to relieve the pain. Stretching and strengthening exercises after resolution of pain will likely help reduce the risk of recurrence. Referral to a physical therapist or athletic trainer for further evaluation and treatment may be helpful. A walking boot or cast may be recommended to rest the Achilles tendon. This can help break the cycle of inflammation and microtrauma. Arch supports (orthotics) may be prescribed or recommended by your caregiver as an adjunct to therapy and rest. Surgery to remove the inflamed tendon lining or degenerated tendon tissue is rarely necessary and has shown less than predictable results. MEDICATION  Nonsteroidal anti-inflammatory medications, such as aspirin and ibuprofen , may be used for pain and inflammation relief. Do not take within 7 days before surgery. Take these as directed by your caregiver. Contact your caregiver immediately if any bleeding, stomach upset, or signs of allergic reaction occur. Other minor pain relievers, such as acetaminophen , may also be used. Pain relievers may be prescribed as necessary by your caregiver. Do not take prescription pain medication for longer than 4 to 7 days. Use only as directed and only as much as you need. Cortisone injections are rarely indicated. Cortisone injections may weaken tendons and predispose to rupture. It is better to give the condition more time to heal than to use them. HEAT AND COLD Cold is used to relieve pain and reduce inflammation for acute and chronic Achilles tendinitis. Cold should be applied for 10 to 15 minutes every 2 to 3 hours for inflammation and pain and immediately after any activity that aggravates your symptoms. Use ice packs or an ice massage. Heat may be used before performing stretching  and  strengthening activities prescribed by your caregiver. Use a heat pack or a warm soak. SEEK MEDICAL CARE IF: Symptoms get worse or do not improve in 2 weeks despite treatment. New, unexplained symptoms develop. Drugs used in treatment may produce side effects.  EXERCISES:  RANGE OF MOTION (ROM) AND STRETCHING EXERCISES - Achilles Tendinitis  These exercises may help you when beginning to rehabilitate your injury. Your symptoms may resolve with or without further involvement from your physician, physical therapist or athletic trainer. While completing these exercises, remember:  Restoring tissue flexibility helps normal motion to return to the joints. This allows healthier, less painful movement and activity. An effective stretch should be held for at least 30 seconds. A stretch should never be painful. You should only feel a gentle lengthening or release in the stretched tissue.  STRETCH  Gastroc, Standing  Place hands on wall. Extend right / left leg, keeping the front knee somewhat bent. Slightly point your toes inward on your back foot. Keeping your right / left heel on the floor and your knee straight, shift your weight toward the wall, not allowing your back to arch. You should feel a gentle stretch in the right / left calf. Hold this position for 10 seconds. Repeat 3 times. Complete this stretch 2 times per day.  STRETCH  Soleus, Standing  Place hands on wall. Extend right / left leg, keeping the other knee somewhat bent. Slightly point your toes inward on your back foot. Keep your right / left heel on the floor, bend your back knee, and slightly shift your weight over the back leg so that you feel a gentle stretch deep in your back calf. Hold this position for 10 seconds. Repeat 3 times. Complete this stretch 2 times per day.  STRETCH  Gastrocsoleus, Standing  Note: This exercise can place a lot of stress on your foot and ankle. Please complete this exercise only if specifically  instructed by your caregiver.  Place the ball of your right / left foot on a step, keeping your other foot firmly on the same step. Hold on to the wall or a rail for balance. Slowly lift your other foot, allowing your body weight to press your heel down over the edge of the step. You should feel a stretch in your right / left calf. Hold this position for 10 seconds. Repeat this exercise with a slight bend in your knee. Repeat 3 times. Complete this stretch 2 times per day.   STRENGTHENING EXERCISES - Achilles Tendinitis These exercises may help you when beginning to rehabilitate your injury. They may resolve your symptoms with or without further involvement from your physician, physical therapist or athletic trainer. While completing these exercises, remember:  Muscles can gain both the endurance and the strength needed for everyday activities through controlled exercises. Complete these exercises as instructed by your physician, physical therapist or athletic trainer. Progress the resistance and repetitions only as guided. You may experience muscle soreness or fatigue, but the pain or discomfort you are trying to eliminate should never worsen during these exercises. If this pain does worsen, stop and make certain you are following the directions exactly. If the pain is still present after adjustments, discontinue the exercise until you can discuss the trouble with your clinician.  STRENGTH - Plantar-flexors  Sit with your right / left leg extended. Holding onto both ends of a rubber exercise band/tubing, loop it around the ball of your foot. Keep a slight tension in the band. Slowly  push your toes away from you, pointing them downward. Hold this position for 10 seconds. Return slowly, controlling the tension in the band/tubing. Repeat 3 times. Complete this exercise 2 times per day.   STRENGTH - Plantar-flexors  Stand with your feet shoulder width apart. Steady yourself with a wall or table  using as little support as needed. Keeping your weight evenly spread over the width of your feet, rise up on your toes.* Hold this position for 10 seconds. Repeat 3 times. Complete this exercise 2 times per day.  *If this is too easy, shift your weight toward your right / left leg until you feel challenged. Ultimately, you may be asked to do this exercise with your right / left foot only.  STRENGTH  Plantar-flexors, Eccentric  Note: This exercise can place a lot of stress on your foot and ankle. Please complete this exercise only if specifically instructed by your caregiver.  Place the balls of your feet on a step. With your hands, use only enough support from a wall or rail to keep your balance. Keep your knees straight and rise up on your toes. Slowly shift your weight entirely to your right / left toes and pick up your opposite foot. Gently and with controlled movement, lower your weight through your right / left foot so that your heel drops below the level of the step. You will feel a slight stretch in the back of your calf at the end position. Use the healthy leg to help rise up onto the balls of both feet, then lower weight only on the right / left leg again. Build up to 15 repetitions. Then progress to 3 consecutive sets of 15 repetitions.* After completing the above exercise, complete the same exercise with a slight knee bend (about 30 degrees). Again, build up to 15 repetitions. Then progress to 3 consecutive sets of 15 repetitions.* Perform this exercise 2 times per day.  *When you easily complete 3 sets of 15, your physician, physical therapist or athletic trainer may advise you to add resistance by wearing a backpack filled with additional weight.  STRENGTH - Plantar Flexors, Seated  Sit on a chair that allows your feet to rest flat on the ground. If necessary, sit at the edge of the chair. Keeping your toes firmly on the ground, lift your right / left heel as far as you can without  increasing any discomfort in your ankle. Repeat 3 times. Complete this exercise 2 times a day.  Look for an EvenUp shoe attachment on [employer] or at [employer]. This will level out your hips while you are walking in the CAM boot. Wear this on the other foot around a supportive sneaker:    I also recommend looking for an over-the-counter compressive ankle sleeve to help with any swelling.  You can massage over-the-counter Voltaren  gel into the area 3-4 times a day.  While taking the diclofenac , do not take any other oral anti-inflammatory medications such as ibuprofen , Motrin , Aleve .   While you are able to use the walking boot to ambulate, try and limit your activity and avoid excessive weightbearing whenever possible.

## 2024-01-14 ENCOUNTER — Ambulatory Visit
Admission: RE | Admit: 2024-01-14 | Discharge: 2024-01-14 | Disposition: A | Discharge status: Home / Self Care | Source: Ambulatory Visit | Attending: Physician Assistant | Admitting: Physician Assistant

## 2024-01-14 DIAGNOSIS — Z1231 Encounter for screening mammogram for malignant neoplasm of breast: Secondary | ICD-10-CM

## 2024-02-05 ENCOUNTER — Ambulatory Visit: Admitting: Podiatry

## 2024-02-24 ENCOUNTER — Other Ambulatory Visit: Payer: Self-pay | Admitting: Podiatry

## 2024-02-24 DIAGNOSIS — M7662 Achilles tendinitis, left leg: Secondary | ICD-10-CM

## 2024-02-27 ENCOUNTER — Encounter: Payer: Self-pay | Admitting: Podiatry

## 2024-02-27 ENCOUNTER — Ambulatory Visit (INDEPENDENT_AMBULATORY_CARE_PROVIDER_SITE_OTHER): Admitting: Podiatry

## 2024-02-27 DIAGNOSIS — M7662 Achilles tendinitis, left leg: Secondary | ICD-10-CM

## 2024-02-27 MED ORDER — DICLOFENAC SODIUM 75 MG PO TBEC
75.0000 mg | DELAYED_RELEASE_TABLET | Freq: Two times a day (BID) | ORAL | 0 refills | Status: AC
Start: 2024-02-27 — End: 2024-03-13

## 2024-02-27 NOTE — Patient Instructions (Addendum)
 Look for an EvenUp shoe attachment on [employer] or at [employer]. This will level out your hips while you are walking in the CAM boot. Wear this on the other foot around a supportive sneaker:   I have ordered a referral to physical therapy. If you do not hear for them about scheduling within the next 1 week, or you have any questions please give us  a call at [phone].

## 2024-02-27 NOTE — Progress Notes (Signed)
  Subjective:  Patient ID: Emily Ochoa, female    DOB: [birth year],  MRN: [id number]  Chief Complaint  Patient presents with   Follow-up    F/U Calcific Achilles tendinitis of left lower extremity. 70% better. 0 pain. Exercising daily. Diclofenac  tablets helped.    Discussed the use of AI scribe software for clinical note transcription with the patient, who gave verbal consent to proceed.  History of Present Illness Emily Ochoa is a 43 year old female with left heel insertional Achilles tendinitis who presents for follow-up.  She experiences significant improvement in left heel pain, with an estimated 60-70% recovery. She no longer consistently uses a boot and can wear regular tennis shoes for light activities. She performs exercises three to four times daily and takes pain medication as needed, not exceeding twice daily.  She has not fully resumed her previous workout routine but experiences minimal to no pain. She continues with prescribed exercises and medication, finding them helpful.  She recently returned to work at a daycare and inquires about a note for wearing the boot at work. She uses an insole to balance her gait, as she has not obtained an 'even up' for her non-booted foot.      Objective:    Physical Exam EXTREMITIES: Minimal pain on palpation of left posterior heel and achilles insertion. No tendon thickening. Good range of motion in left ankle joint. Muscle strength 5/5 in left lower extremity. Pes planus foot type. DP and PT pulses intact bilaterally. NEUROLOGICAL: Protective and light touch sensation intact in left foot. SKIN: Pedal skin well hydrated, normal texture and turgor.   No images are attached to the encounter.    Results Radiographic Exam: Left foot 3 views weightbearing 01/07/2024 Normal osseous mineralization. Joint spaces preserved.  Pes planus foot type.  Spur formation present posterior heel.   Assessment:   1. Calcific Achilles  tendinitis of left lower extremity      Plan:  Patient was evaluated and treated and all questions answered.  Assessment and Plan Assessment & Plan Left calcific Achilles tendinitis Significant improvement with minimal pain. Good range of motion and muscle strength. Compliance with stretching and boot use noted. Risk of flare-up if not managed. - Continue stretching exercises 3-4 times daily. - Use boot during increased activity to prevent flare-ups. - Wear tennis shoes with good inserts for light activities. - Use felt heel pad to reduce heel pressure. - Consider icing with frozen water ball massage for pain relief. - Continue medication as needed, not exceeding twice daily, with food and water.  Refill of diclofenac  75 mg twice daily as needed to patient's pharmacy, 30 tablets. - Refer to physical therapy for dedicated sessions. - Provide work note for boot use. - Recommend 'even up' for non-booted foot to prevent discomfort.      Return in about 6 weeks (around 04/09/2024) for left achilles tendonitis.

## 2024-03-09 NOTE — Progress Notes (Shared)
 [facility] - Clinic Note  03/23/2024   CHIEF COMPLAINT Patient presents for No chief complaint on file.  HISTORY OF PRESENT ILLNESS: Emily Ochoa is a 43 y.o. female who presents to the clinic today for:   Patient is a new diabetic and isn't really having any vision issues.  Referring physician: No referring provider defined for this encounter.  HISTORICAL INFORMATION:  Selected notes from the MEDICAL RECORD NUMBER Referred by Wanda Cousin, PA-C for DM eye exam LEE:  Ocular Hx- PMH-   CURRENT MEDICATIONS: No current outpatient medications on file. (Ophthalmic Drugs)   No current facility-administered medications for this visit. (Ophthalmic Drugs)   Current Outpatient Medications (Other)  Medication Sig   albuterol  (VENTOLIN  HFA) 108 (90 Base) MCG/ACT inhaler INHALE 2 PUFFS BY MOUTH EVERY 4 TO 6 HOURS AS NEEDED FOR COUGH FOR WHEEZING FOR SHORTNESS OF BREATH   baclofen  (LIORESAL ) 10 MG tablet TAKE 1 TABLET(10 MG) BY MOUTH THREE TIMES DAILY   BD PEN NEEDLE NANO 2ND GEN 32G X 4 MM MISC    diclofenac  (VOLTAREN ) 75 MG EC tablet Take 1 tablet (75 mg total) by mouth 2 (two) times daily for 15 days.   ergocalciferol (VITAMIN D2) 1.25 MG (50000 UT) capsule    fluticasone (FLONASE) 50 MCG/ACT nasal spray Use 1 spray in each nostril 2 times daily for 5 days.   loratadine (CLARITIN) 10 MG tablet TAKE 1 TABLET BY MOUTH ONCE DAILY AT NIGHT   montelukast (SINGULAIR) 10 MG tablet Take 10 mg by mouth at bedtime.   naltrexone (DEPADE) 50 MG tablet Take 1 tablet by mouth daily.   propranolol (INDERAL) 80 MG tablet Take 80 mg by mouth daily.   tiZANidine (ZANAFLEX) 2 MG tablet    valACYclovir (VALTREX) 1000 MG tablet Take 1,000 mg by mouth daily.   No current facility-administered medications for this visit. (Other)   REVIEW OF SYSTEMS:   ALLERGIES Allergies  Allergen Reactions   Latex Hives, Itching and Rash   Shellfish Allergy Anaphylaxis and Swelling    Wheat Rash    Other reaction(s): Abdominal Pain     Almond (Diagnostic) Swelling   Barley Grass Other (See Comments)    Upset stomach   Dairycare [Bacid] Diarrhea   Gramineae Pollens Itching   PAST MEDICAL HISTORY Past Medical History:  Diagnosis Date   Abdominal pain 11/20/2011   Atypical facial pain 11/20/2011   Backache    Bulging lumbar disc 11/03/2014   Cardiac dysrhythmia 08/12/2011   Carpal tunnel syndrome 11/20/2011   Cholecystitis 06/16/2016   Diplopia 11/20/2011   Edema 11/20/2011   Elevated blood sugar 11/03/2014   Essential hypertension 08/12/2011   Fibromyalgia    Gastroesophageal reflux disease 11/04/2014   Gout 11/20/2011   Herpes simplex 11/20/2011   Iron deficiency anemia secondary to inadequate dietary iron intake 11/20/2011   Lytic lesion of bone on x-ray 05/17/2020   Major depressive disorder 11/14/2021   Malaise and fatigue 11/20/2011   Migraine headache    Mild cognitive impairment 11/27/2021   Morbid obesity with BMI of 60.0-69.9, adult 11/14/2021   Myalgia 11/20/2011   Nausea with vomiting 11/20/2011   Palpitations 08/30/2011   Pruritus of genitalia 11/20/2011   Shortness of breath 08/30/2011   URI (upper respiratory infection)    Past Surgical History:  Procedure Laterality Date   BACK SURGERY     bioposy in 2009   CHOLECYSTECTOMY     FAMILY HISTORY Family History  Problem Relation Age of Onset  Hypertension Mother    Diabetes Father    Hyperlipidemia Father    Breast cancer Sister 87   Diabetes Sister    Dementia Maternal Grandmother    Birth defects Maternal Grandfather    Dementia Maternal Grandfather    Dementia Paternal Grandfather    SOCIAL HISTORY Social History   Tobacco Use   Smoking status: Never   Smokeless tobacco: Never  Substance Use Topics   Alcohol use: No   Drug use: No       OPHTHALMIC EXAM:  Not recorded    IMAGING AND PROCEDURES  Imaging and Procedures for 03/23/2024          ASSESSMENT/PLAN: No diagnosis found.  Diabetes mellitus, type 2 without retinopathy  - A1C 6.7 (06.25.24)  - Diagnosed with Type 2 diabetes about 4 months ago (July 2024)  - Not on Diabetic medication at this time. -- diet controlled - pt reports 70 lbs weight loss since diagnosis - The incidence, risk factors for progression, natural history and treatment options for diabetic retinopathy were discussed with patient.   - The need for close monitoring of blood glucose, blood pressure, and serum lipids, avoiding cigarette or any type of tobacco, and the need for long term follow up was also discussed with patient. - OCT shows no DME OU - f/u in 1 year, sooner prn   2,3. Hypertensive retinopathy OU - discussed importance of tight BP control - monitor   Ophthalmic Meds Ordered this visit:  No orders of the defined types were placed in this encounter.    No follow-ups on file.  There are no Patient Instructions on file for this visit.  Explained the diagnoses, plan, and follow up with the patient and they expressed understanding.  Patient expressed understanding of the importance of proper follow up care.   This document serves as a record of services personally performed by Redell JUDITHANN Hans, MD, PhD. It was created on their behalf by Wanda GEANNIE Keens, COT an ophthalmic technician. The creation of this record is the provider's dictation and/or activities during the visit.    Electronically signed by:  Wanda GEANNIE Keens, COT  03/09/24 7:19 AM  Redell JUDITHANN Hans, M.D., Ph.D. Diseases & Surgery of the Retina and Vitreous [facility] 03/23/2024     Abbreviations: M myopia (nearsighted); A astigmatism; H hyperopia (farsighted); P presbyopia; Mrx spectacle prescription;  CTL contact lenses; OD right eye; OS left eye; OU both eyes  XT exotropia; ET esotropia; PEK punctate epithelial keratitis; PEE punctate epithelial erosions; DES dry eye syndrome; MGD meibomian  gland dysfunction; ATs artificial tears; PFAT's preservative free artificial tears; NSC nuclear sclerotic cataract; PSC posterior subcapsular cataract; ERM epi-retinal membrane; PVD posterior vitreous detachment; RD retinal detachment; DM diabetes mellitus; DR diabetic retinopathy; NPDR non-proliferative diabetic retinopathy; PDR proliferative diabetic retinopathy; CSME clinically significant macular edema; DME diabetic macular edema; dbh dot blot hemorrhages; CWS cotton wool spot; POAG primary open angle glaucoma; C/D cup-to-disc ratio; HVF humphrey visual field; GVF goldmann visual field; OCT optical coherence tomography; IOP intraocular pressure; BRVO Branch retinal vein occlusion; CRVO central retinal vein occlusion; CRAO central retinal artery occlusion; BRAO branch retinal artery occlusion; RT retinal tear; SB scleral buckle; PPV pars plana vitrectomy; VH Vitreous hemorrhage; PRP panretinal laser photocoagulation; IVK intravitreal kenalog ; VMT vitreomacular traction; MH Macular hole;  NVD neovascularization of the disc; NVE neovascularization elsewhere; AREDS age related eye disease study; ARMD age related macular degeneration; POAG primary open angle glaucoma; EBMD epithelial/anterior  basement membrane dystrophy; ACIOL anterior chamber intraocular lens; IOL intraocular lens; PCIOL posterior chamber intraocular lens; Phaco/IOL phacoemulsification with intraocular lens placement; PRK photorefractive keratectomy; LASIK laser assisted in situ keratomileusis; HTN hypertension; DM diabetes mellitus; COPD chronic obstructive pulmonary disease

## 2024-03-23 ENCOUNTER — Encounter (INDEPENDENT_AMBULATORY_CARE_PROVIDER_SITE_OTHER): Payer: Medicare Other | Admitting: Ophthalmology

## 2024-03-23 ENCOUNTER — Ambulatory Visit: Admitting: Physical Therapy

## 2024-03-23 DIAGNOSIS — E119 Type 2 diabetes mellitus without complications: Secondary | ICD-10-CM

## 2024-03-23 DIAGNOSIS — I10 Essential (primary) hypertension: Secondary | ICD-10-CM

## 2024-03-23 DIAGNOSIS — H35033 Hypertensive retinopathy, bilateral: Secondary | ICD-10-CM

## 2024-04-01 ENCOUNTER — Other Ambulatory Visit: Payer: Self-pay

## 2024-04-01 ENCOUNTER — Ambulatory Visit

## 2024-04-01 DIAGNOSIS — M79662 Pain in left lower leg: Secondary | ICD-10-CM | POA: Insufficient documentation

## 2024-04-01 DIAGNOSIS — M6281 Muscle weakness (generalized): Secondary | ICD-10-CM | POA: Insufficient documentation

## 2024-04-01 DIAGNOSIS — M7660 Achilles tendinitis, unspecified leg: Secondary | ICD-10-CM | POA: Diagnosis present

## 2024-04-01 DIAGNOSIS — M7662 Achilles tendinitis, left leg: Secondary | ICD-10-CM | POA: Diagnosis not present

## 2024-04-01 DIAGNOSIS — R2689 Other abnormalities of gait and mobility: Secondary | ICD-10-CM | POA: Diagnosis not present

## 2024-04-01 NOTE — Therapy (Signed)
 OUTPATIENT PHYSICAL THERAPY LOWER EXTREMITY EVALUATION   Patient Name: Emily Ochoa MRN: [id number] DOB:24-Sep-1980, 43 y.o., female Today's Date: 04/01/2024  END OF SESSION:  PT End of Session - 04/01/24 0955     Visit Number 1    Number of Visits 16    Date for Recertification  05/27/24    Authorization Type [employer] DUAL COMPLETE/ MCD    Authorization Time Period no auth required    Progress Note Due on Visit 10    PT Start Time 1000    PT Stop Time 1045    PT Time Calculation (min) 45 min    Activity Tolerance Patient tolerated treatment well    Behavior During Therapy [facility] for tasks assessed/performed          Past Medical History:  Diagnosis Date   Abdominal pain 11/20/2011   Atypical facial pain 11/20/2011   Backache    Bulging lumbar disc 11/03/2014   Cardiac dysrhythmia 08/12/2011   Carpal tunnel syndrome 11/20/2011   Cholecystitis 06/16/2016   Diplopia 11/20/2011   Edema 11/20/2011   Elevated blood sugar 11/03/2014   Essential hypertension 08/12/2011   Fibromyalgia    Gastroesophageal reflux disease 11/04/2014   Gout 11/20/2011   Herpes simplex 11/20/2011   Iron deficiency anemia secondary to inadequate dietary iron intake 11/20/2011   Lytic lesion of bone on x-ray 05/17/2020   Major depressive disorder 11/14/2021   Malaise and fatigue 11/20/2011   Migraine headache    Mild cognitive impairment 11/27/2021   Morbid obesity with BMI of 60.0-69.9, adult 11/14/2021   Myalgia 11/20/2011   Nausea with vomiting 11/20/2011   Palpitations 08/30/2011   Pruritus of genitalia 11/20/2011   Shortness of breath 08/30/2011   URI (upper respiratory infection)    Past Surgical History:  Procedure Laterality Date   BACK SURGERY     bioposy in 2009   CHOLECYSTECTOMY     Patient Active Problem List   Diagnosis Date Noted   Fibromyalgia 11/27/2021   Mild cognitive impairment 11/27/2021   Backache    Migraine headache    Morbid obesity with BMI of 60.0-69.9, adult  11/14/2021   Major depressive disorder 11/14/2021   Lytic lesion of bone on x-ray 05/17/2020   Cholecystitis 06/16/2016   Gastroesophageal reflux disease 11/04/2014   Elevated blood sugar 11/03/2014   Bulging lumbar disc 11/03/2014   Atypical facial pain 11/20/2011   Carpal tunnel syndrome 11/20/2011   Diplopia 11/20/2011   Edema 11/20/2011   Gout 11/20/2011   Herpes simplex 11/20/2011   Iron deficiency anemia secondary to inadequate dietary iron intake 11/20/2011   Malaise and fatigue 11/20/2011   Myalgia 11/20/2011   Pruritus of genitalia 11/20/2011   Palpitations 08/30/2011   Shortness of breath 08/30/2011   Cardiac dysrhythmia 08/12/2011   Essential hypertension 08/12/2011    PCP: Missy Reusing, PA-C  REFERRING PROVIDER: Lamount Ethan CROME, DPM  REFERRING DIAG: [phone] (ICD-10-CM) - 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 She reports having lost weight and beginning going to the gym/running since earlier this year and suddenly could not put her foot down. Pt notes no trauma to the ankle/foot. She was put into a boot which she wore for about a month. Her pain has improved greatly but she is not back to  her gym routine. The podiatrist wants the pt to continue use of the boot but the pt reports L hip pain when wearing it. Pt is having pain with prolonged walking, squatting, running, and use of the elliptical.  She has a history of plantar fasciitis.    PERTINENT HISTORY: Fibromyalgia, HTN PAIN:  Are you having pain? Yes: NPRS scale: 0/10; 4/10 worst; 0/10 best Pain location: L achilles Pain description: tension Aggravating factors: squats, lots of walking, running, elliptical  Relieving factors: ice, stretches  PRECAUTIONS: None  RED  FLAGS: None   WEIGHT BEARING RESTRICTIONS: No  FALLS:  Has patient fallen in last 6 months? No  LIVING ENVIRONMENT: Lives with: lives alone Lives in: House/apartment Stairs: No Has following equipment at home: None  OCCUPATION: daycare  PLOF: Independent  PATIENT GOALS: return to the gym   NEXT MD VISIT: 12/181/2025  OBJECTIVE:  Note: Objective measures were completed at Evaluation unless otherwise noted.  PATIENT SURVEYS:  LEFS  Extreme difficulty/unable (0), Quite a bit of difficulty (1), Moderate difficulty (2), Little difficulty (3), No difficulty (4) Survey date:  04/01/2024   Any of your usual work, housework or school activities 2  2. Usual hobbies, recreational or sporting activities 2  3. Getting into/out of the bath 4  4. Walking between rooms 2  5. Putting on socks/shoes 2  6. Squatting  1  7. Lifting an object, like a bag of groceries from the floor 1  8. Performing light activities around your home 2  9. Performing heavy activities around your home 0  10. Getting into/out of a car 4  11. Walking 2 blocks 1  12. Walking 1 mile 0  13. Going up/down 10 stairs (1 flight) 2  14. Standing for 1 hour 2  15.  sitting for 1 hour 4  16. Running on even ground 1  17. Running on uneven ground 1  18. Making sharp turns while running fast 1  19. Hopping  1  20. Rolling over in bed 4  Score total:  37/80     COGNITION: Overall cognitive status: Within functional limits for tasks assessed     SENSATION: Not tested  POSTURE: No Significant postural limitations  PALPATION: Tenderness of L calf  LOWER EXTREMITY ROM:  Active ROM Right eval Left eval  Hip flexion    Hip extension 5 5  Hip abduction WNL WNL  Hip adduction    Hip internal rotation    Hip external rotation    Knee flexion    Knee extension    Ankle dorsiflexion 3 3  Ankle plantarflexion WNL WNL  Ankle inversion WNL WNL  Ankle eversion WNL WNL   (Blank rows = not tested)  LOWER  EXTREMITY MMT:  MMT Right eval Left eval  Hip flexion    Hip extension 4/5 4/5  Hip abduction 4/5 4/5  Hip adduction    Hip internal rotation    Hip external rotation    Knee flexion    Knee extension    Ankle dorsiflexion 5/5 5/5  Ankle plantarflexion 5/5 5/5  Ankle inversion 5/5 5/5  Ankle eversion 5/5 5/5   (Blank rows = not tested)  FUNCTIONAL TESTS:  Functional squat - tension in L achilles, unsteady  SLS - mod sway B  SL calf raise - able B but unsteady  TREATMENT DATE:  [facility] Adult PT Treatment:                                                DATE: 04/01/2024    Initial evaluation: see patient education and home exercise program as noted below     PATIENT EDUCATION:  Education details: POC, HEP, diagnosis, prognosis. Person educated: Patient Education method: Explanation and Handouts Education comprehension: verbalized understanding and needs further education  HOME EXERCISE PROGRAM: Access Code: [email or web address] URL: [email or web address] Date: 04/01/2024 Prepared by: Marijo Berber  Exercises - Long Sitting Ankle Plantar Flexion with Resistance  - 1 x daily - 7 x weekly - 3 sets - 10 reps - Seated Heel Raise  - 1 x daily - 7 x weekly - 3 sets - 10 reps - Backward Monster Walks  - 1 x daily - 7 x weekly - 3 sets - 10 reps - Downward Dog  - 1 x daily - 7 x weekly - 1 sets - 10 reps - 10sec hold - Clamshell with Resistance  - 1 x daily - 7 x weekly - 3 sets - 10 reps  ASSESSMENT:  CLINICAL IMPRESSION: Patient is a 43 y.o. female who was seen today for physical therapy evaluation and treatment for L ankle pain. Pt has good ankle ROM and strength but poor stability in SLS. Pain is present with prolonged walking and squatting. Both activities challenge the pts balance. She is having limitations at work, with recreational activities, and  ADLs. The pt will benefit from skilled physical therapy to decrease pain and increase function.    OBJECTIVE IMPAIRMENTS: decreased balance, decreased ROM, decreased strength, hypomobility, impaired flexibility, improper body mechanics, and pain.   ACTIVITY LIMITATIONS: lifting and squatting  PARTICIPATION LIMITATIONS: community activity and occupation  PERSONAL FACTORS: 1-2 comorbidities: Fibromyalgia, HTN are also affecting patient's functional outcome.   REHAB POTENTIAL: Good  CLINICAL DECISION MAKING: Evolving/moderate complexity  EVALUATION COMPLEXITY: Moderate   GOALS: Goals reviewed with patient? Yes  SHORT TERM GOALS: Target date: 04/29/2024 Pt will be compliant and independent with HEP to assist with symptom management/recovery at home.  Baseline: 53JPMY2C Goal status: INITIAL  2.  Pt will demonstrate 1 stable SL calf raise B.  Baseline: unstable  Goal status: INITIAL   LONG TERM GOALS: Target date: 05/27/2024  Pt will be able to perform 10 squats with 20lbs or more without LOB.  Baseline: unstable without weight Goal status: INITIAL  2.  Pt will demonstrate 4+/5 or greater strength in B hips.  Baseline:  MMT Right eval Left eval  Hip flexion    Hip extension 4/5 4/5  Hip abduction 4/5 4/5   Goal status: INITIAL  3.  Pt will score 50/80 or greater on the LEFS.  Baseline: 37/80 Goal status: INITIAL  4.  Pt will be comfortable with her final HEP in order to continue any symptom management at home and to avoid regression.   Baseline: 53JPMY2C Goal status: INITIAL   PLAN:  PT FREQUENCY: 2x/week  PT DURATION: 8 weeks  PLANNED INTERVENTIONS: 97164- PT Re-evaluation, 97110-Therapeutic exercises, 97530- Therapeutic activity, 97112- Neuromuscular re-education, 97535- Self Care, 02859- Manual therapy, [id number]- Electrical stimulation (unattended), 20560 (1-2 muscles), 20561 (3+ muscles)- Dry Needling, Patient/Family education, Balance training, Taping, Joint  mobilization, Joint manipulation, Cryotherapy, and Moist heat  PLAN FOR NEXT SESSION: ankle and hip stability, hip  and ankle strengthening, SL activities, stretching for calf. HEP review.    Marijo DELENA Berber, PT 04/01/2024, 11:09 AM

## 2024-04-03 ENCOUNTER — Emergency Department (HOSPITAL_BASED_OUTPATIENT_CLINIC_OR_DEPARTMENT_OTHER)
Admission: EM | Admit: 2024-04-03 | Discharge: 2024-04-03 | Disposition: A | Discharge status: Home / Self Care | Attending: Emergency Medicine | Admitting: Emergency Medicine

## 2024-04-03 ENCOUNTER — Encounter (HOSPITAL_BASED_OUTPATIENT_CLINIC_OR_DEPARTMENT_OTHER): Payer: Self-pay | Admitting: *Deleted

## 2024-04-03 ENCOUNTER — Other Ambulatory Visit: Payer: Self-pay

## 2024-04-03 DIAGNOSIS — B9689 Other specified bacterial agents as the cause of diseases classified elsewhere: Secondary | ICD-10-CM

## 2024-04-03 DIAGNOSIS — Z91040 Latex allergy status: Secondary | ICD-10-CM | POA: Insufficient documentation

## 2024-04-03 DIAGNOSIS — N760 Acute vaginitis: Secondary | ICD-10-CM | POA: Insufficient documentation

## 2024-04-03 LAB — URINALYSIS, W/ REFLEX TO CULTURE (INFECTION SUSPECTED)
Bacteria, UA: NONE SEEN
Bilirubin Urine: NEGATIVE
Glucose, UA: NEGATIVE mg/dL
Hgb urine dipstick: NEGATIVE
Ketones, ur: NEGATIVE mg/dL
Leukocytes,Ua: NEGATIVE
Nitrite: NEGATIVE
Protein, ur: NEGATIVE mg/dL
Specific Gravity, Urine: 1.025 (ref 1.005–1.030)
pH: 6.5 (ref 5.0–8.0)

## 2024-04-03 LAB — WET PREP, GENITAL
Sperm: NONE SEEN
Trich, Wet Prep: NONE SEEN
WBC, Wet Prep HPF POC: >=10 — AB (ref <10)
Yeast Wet Prep HPF POC: NONE SEEN

## 2024-04-03 LAB — CBG MONITORING, ED: Glucose-Capillary: 94 mg/dL (ref 70–99)

## 2024-04-03 LAB — PREGNANCY, URINE: Preg Test, Ur: NEGATIVE

## 2024-04-03 MED ORDER — METRONIDAZOLE 500 MG PO TABS
500.0000 mg | ORAL_TABLET | Freq: Once | ORAL | Status: AC
  Administered 2024-04-03: 500 mg via ORAL
  Filled 2024-04-03: qty 1

## 2024-04-03 MED ORDER — METRONIDAZOLE 500 MG PO TABS: 500.0000 mg | ORAL_TABLET | Freq: Two times a day (BID) | ORAL | 0 refills | Status: AC

## 2024-04-03 MED ORDER — FLUCONAZOLE 150 MG PO TABS
150.0000 mg | ORAL_TABLET | Freq: Once | ORAL | Status: AC
  Administered 2024-04-03: 150 mg via ORAL
  Filled 2024-04-03: qty 1

## 2024-04-03 MED ORDER — FLUCONAZOLE 200 MG PO TABS: 200.0000 mg | ORAL_TABLET | Freq: Every day | ORAL | 0 refills | Status: AC

## 2024-04-03 NOTE — Discharge Instructions (Addendum)
 It was a pleasure taking care of you here today  As we discussed you have bacterial vaginosis.  This is not an STI this is an overgrowth of the normal bacteria that was in the vagina.  We are treating this with a medication called Flagyl .  As we discussed in the room you cannot drink alcohol while taking this medication or for 48 hours afterwards.  I gave you a dose of Diflucan  here as sometimes people will get yeast infections after antibiotics.  I wrote for an additional dose at home to take at day #3.  Make sure to follow-up outpatient, return for new or worsening symptoms

## 2024-04-03 NOTE — ED Provider Notes (Signed)
 [facility] AT [facility] Provider Note   CSN: [id number] Arrival date & time: 04/03/24  8082     Patient presents with: Dysuria   Emily Ochoa is a 43 y.o. female here for evaluation of dysuria and frequency.  Started a week ago after using new soap while at a family member's house.  Notes some vaginal irritation and now having some white vaginal discharge.  No concern for STI.  No fever, nausea, vomiting, hematuria, abd pain.   HPI     Prior to Admission medications   Medication Sig Start Date End Date Taking? Authorizing Provider  fluconazole  (DIFLUCAN ) 200 MG tablet Take 1 tablet (200 mg total) by mouth daily for 1 day. 04/03/24 04/04/24 Yes Sherri Levenhagen A, PA-C  metroNIDAZOLE  (FLAGYL ) 500 MG tablet Take 1 tablet (500 mg total) by mouth 2 (two) times daily. 04/03/24  Yes Huberta Tompkins A, PA-C  albuterol  (VENTOLIN  HFA) 108 (90 Base) MCG/ACT inhaler INHALE 2 PUFFS BY MOUTH EVERY 4 TO 6 HOURS AS NEEDED FOR COUGH FOR WHEEZING FOR SHORTNESS OF BREATH 04/02/19   [provider]  baclofen  (LIORESAL ) 10 MG tablet TAKE 1 TABLET(10 MG) BY MOUTH THREE TIMES DAILY 09/28/21   Skeet Juliene SAUNDERS, DO  BD PEN NEEDLE NANO 2ND GEN 32G X 4 MM MISC  08/15/21   [provider]  ergocalciferol (VITAMIN D2) 1.25 MG (50000 UT) capsule  03/15/19   [provider]  fluticasone (FLONASE) 50 MCG/ACT nasal spray Use 1 spray in each nostril 2 times daily for 5 days. 01/15/21   [provider]  loratadine (CLARITIN) 10 MG tablet TAKE 1 TABLET BY MOUTH ONCE DAILY AT NIGHT 03/22/19   [provider]  montelukast (SINGULAIR) 10 MG tablet Take 10 mg by mouth at bedtime. 08/13/21   [provider]  naltrexone (DEPADE) 50 MG tablet Take 1 tablet by mouth daily.    [provider]  propranolol (INDERAL) 80 MG tablet Take 80 mg by mouth daily.    [provider]  tiZANidine (ZANAFLEX) 2 MG tablet  05/03/20   [provider]  valACYclovir (VALTREX) 1000 MG tablet Take 1,000 mg by mouth daily. 06/28/21   [provider]    Allergies: Latex, Shellfish allergy, Wheat, Almond (diagnostic), Barley grass, Dairycare [bacid], and Gramineae pollens    Review of Systems  Constitutional: Negative.   HENT: Negative.    Respiratory: Negative.    Cardiovascular: Negative.   Gastrointestinal: Negative.   Genitourinary:  Positive for dysuria, frequency and vaginal discharge. Negative for decreased urine volume, difficulty urinating, flank pain, genital sores, hematuria, menstrual problem, pelvic pain, urgency, vaginal bleeding and vaginal pain.  All other systems reviewed and are negative.   Updated Vital Signs BP 118/75 (BP Location: Right Arm)   Pulse 74   Temp 97.6 F (36.4 C)   Resp 16   SpO2 90%   Physical Exam Vitals and nursing note reviewed.  Constitutional:      General: She is not in acute distress.    Appearance: She is well-developed. She is not ill-appearing.  HENT:     Head: Atraumatic.  Eyes:     Pupils: Pupils are equal, round, and reactive to light.  Cardiovascular:     Rate and Rhythm: Normal rate.  Pulmonary:     Effort: No respiratory distress.  Abdominal:     General: There is no distension.  Musculoskeletal:        General: Normal range of motion.  Cervical back: Normal range of motion.  Skin:    General: Skin is warm and dry.  Neurological:     General: No focal deficit present.     Mental Status: She is alert.  Psychiatric:        Mood and Affect: Mood normal.     (all labs ordered are listed, but only abnormal results are displayed) Labs Reviewed  WET PREP, GENITAL - Abnormal; Notable for the following components:      Result Value   Clue Cells Wet Prep HPF POC PRESENT (*)    WBC, Wet Prep HPF POC >=10 (*)    All other components within normal limits  URINALYSIS, W/ REFLEX TO CULTURE (INFECTION SUSPECTED)  PREGNANCY, URINE  CBG MONITORING, ED   GC/CHLAMYDIA PROBE AMP ([place]) NOT AT [facility]    EKG: None  Radiology: No results found.   Procedures   Medications Ordered in the ED  metroNIDAZOLE  (FLAGYL ) tablet 500 mg (has no administration in time range)  fluconazole  (DIFLUCAN ) tablet 150 mg (has no administration in time range)    43 year old here for evaluation of dysuria and frequency over the last week after using a new soap in the shower at a family member's house.  Also noted a white vaginal discharge.  No recent antibiotics.  No concern for STI.  No pelvic pain, abdominal pain.  Plan UA, labs, reassess  Labs personally viewed interpreted UA negative for infection, no glucose urea CBG 94 Pregnancy test negative Wet prep positive for BV GC pending  Patient reassessed.  Will treat with Flagyl .  First dose given here.  She also states she typically gets yeast infections after antibiotics will give dose of Diflucan  here as well as dose for at home.  Discussed not drinking alcohol while taking the Diflucan .  She will follow-up outpatient, return for new or worsening symptoms.  Patient is nontoxic, nonseptic appearing, in no apparent distress.  Patient's pain and other symptoms adequately managed in emergency department.  Fluid bolus given.  Labs, imaging and vitals reviewed.  Patient does not meet the SIRS or Sepsis criteria.  On repeat exam patient does not have a surgical abdomin and there are no peritoneal signs.  No indication of appendicitis, bowel obstruction, bowel perforation, cholecystitis, diverticulitis, PID, intermittent, persistent torsion, TOA, ectopic pregnancy, AAA, dissection, traumatic injury.  Patient discharged home with symptomatic treatment and given strict instructions for follow-up with their primary care physician.  I have also discussed reasons to return immediately to the ER.  Patient expresses understanding and agrees with plan.                                   Medical Decision Making Amount  and/or Complexity of Data Reviewed Independent Historian: friend External Data Reviewed: labs. Labs: ordered. Decision-making details documented in ED Course.  Risk OTC drugs. Prescription drug management. Decision regarding hospitalization. Diagnosis or treatment significantly limited by social determinants of health.        Final diagnoses:  Bacterial vaginosis    ED Discharge Orders          Ordered    fluconazole  (DIFLUCAN ) 200 MG tablet  Daily        04/03/24 2037    metroNIDAZOLE  (FLAGYL ) 500 MG tablet  2 times daily        04/03/24 2037               Denham Mose,  Maysun Meditz A, PA-C 04/03/24 2040    Ruthe Cornet, DO 04/03/24 2158

## 2024-04-03 NOTE — ED Triage Notes (Signed)
 Pt to ED reporting dysuria and urinary frequency for the past week. No fevers.

## 2024-04-05 LAB — GC/CHLAMYDIA PROBE AMP (~~LOC~~) NOT AT ARMC
Chlamydia: NEGATIVE
Comment: NEGATIVE
Comment: NORMAL
Neisseria Gonorrhea: NEGATIVE

## 2024-04-14 ENCOUNTER — Ambulatory Visit

## 2024-04-14 DIAGNOSIS — M7660 Achilles tendinitis, unspecified leg: Secondary | ICD-10-CM

## 2024-04-14 DIAGNOSIS — M7662 Achilles tendinitis, left leg: Secondary | ICD-10-CM | POA: Diagnosis not present

## 2024-04-14 NOTE — Therapy (Signed)
 OUTPATIENT PHYSICAL THERAPY NOTE   Patient Name: Avaeh Ewer MRN: [id number] DOB:1981/02/26, 43 y.o., female Today's Date: 04/14/2024  END OF SESSION:  PT End of Session - 04/14/24 1403     Visit Number 2    Number of Visits 16    Date for Recertification  05/27/24    Authorization Type [employer] DUAL COMPLETE/ MCD    Authorization Time Period no auth required    PT Start Time 1400    PT Stop Time 1439    PT Time Calculation (min) 39 min    Activity Tolerance Patient tolerated treatment well    Behavior During Therapy [facility] for tasks assessed/performed           Past Medical History:  Diagnosis Date   Abdominal pain 11/20/2011   Atypical facial pain 11/20/2011   Backache    Bulging lumbar disc 11/03/2014   Cardiac dysrhythmia 08/12/2011   Carpal tunnel syndrome 11/20/2011   Cholecystitis 06/16/2016   Diplopia 11/20/2011   Edema 11/20/2011   Elevated blood sugar 11/03/2014   Essential hypertension 08/12/2011   Fibromyalgia    Gastroesophageal reflux disease 11/04/2014   Gout 11/20/2011   Herpes simplex 11/20/2011   Iron deficiency anemia secondary to inadequate dietary iron intake 11/20/2011   Lytic lesion of bone on x-ray 05/17/2020   Major depressive disorder 11/14/2021   Malaise and fatigue 11/20/2011   Migraine headache    Mild cognitive impairment 11/27/2021   Morbid obesity with BMI of 60.0-69.9, adult 11/14/2021   Myalgia 11/20/2011   Nausea with vomiting 11/20/2011   Palpitations 08/30/2011   Pruritus of genitalia 11/20/2011   Shortness of breath 08/30/2011   URI (upper respiratory infection)    Past Surgical History:  Procedure Laterality Date   BACK SURGERY     bioposy in 2009   CHOLECYSTECTOMY     Patient Active Problem List   Diagnosis Date Noted   Fibromyalgia 11/27/2021   Mild cognitive impairment 11/27/2021   Backache    Migraine headache    Morbid obesity with BMI of 60.0-69.9, adult 11/14/2021   Major depressive disorder 11/14/2021    Lytic lesion of bone on x-ray 05/17/2020   Cholecystitis 06/16/2016   Gastroesophageal reflux disease 11/04/2014   Elevated blood sugar 11/03/2014   Bulging lumbar disc 11/03/2014   Atypical facial pain 11/20/2011   Carpal tunnel syndrome 11/20/2011   Diplopia 11/20/2011   Edema 11/20/2011   Gout 11/20/2011   Herpes simplex 11/20/2011   Iron deficiency anemia secondary to inadequate dietary iron intake 11/20/2011   Malaise and fatigue 11/20/2011   Myalgia 11/20/2011   Pruritus of genitalia 11/20/2011   Palpitations 08/30/2011   Shortness of breath 08/30/2011   Cardiac dysrhythmia 08/12/2011   Essential hypertension 08/12/2011    PCP: Missy Reusing, PA-C  REFERRING PROVIDER: Lamount Ethan CROME, DPM  REFERRING DIAG: [phone] (ICD-10-CM) - Calcific Achilles tendinitis of left lower extremity  THERAPY DIAG:  Achilles tendon pain  Rationale for Evaluation and Treatment: Rehabilitation  ONSET DATE: 2 months ago  SUBJECTIVE:   SUBJECTIVE STATEMENT: 04/14/2024 patient had increased pain/soreness after eval that lasted for about 3-4 days. She states that her hip bothers her more than the ankle. She has not been wearing her boot at work.   EVAL: Pt reports her pain began 2 months ago after going to the gym. She reports having lost weight and beginning going to the gym/running since earlier this year and suddenly could not put her foot down. Pt notes no trauma to  the ankle/foot. She was put into a boot which she wore for about a month. Her pain has improved greatly but she is not back to her gym routine. The podiatrist wants the pt to continue use of the boot but the pt reports L hip pain when wearing it. Pt is having pain with prolonged walking, squatting, running, and use of the elliptical.  She has a history of plantar fasciitis.    PERTINENT HISTORY: Fibromyalgia, HTN PAIN:  Are you having pain? Yes: NPRS scale: 0/10; 4/10 worst; 0/10 best Pain location: L achilles Pain  description: tension Aggravating factors: squats, lots of walking, running, elliptical  Relieving factors: ice, stretches  PRECAUTIONS: None  RED FLAGS: None   WEIGHT BEARING RESTRICTIONS: No  FALLS:  Has patient fallen in last 6 months? No  LIVING ENVIRONMENT: Lives with: lives alone Lives in: House/apartment Stairs: No Has following equipment at home: None  OCCUPATION: daycare  PLOF: Independent  PATIENT GOALS: return to the gym   NEXT MD VISIT: 12/181/2025  OBJECTIVE:  Note: Objective measures were completed at Evaluation unless otherwise noted.  PATIENT SURVEYS:  LEFS  Extreme difficulty/unable (0), Quite a bit of difficulty (1), Moderate difficulty (2), Little difficulty (3), No difficulty (4) Survey date:  04/01/2024   Any of your usual work, housework or school activities 2  2. Usual hobbies, recreational or sporting activities 2  3. Getting into/out of the bath 4  4. Walking between rooms 2  5. Putting on socks/shoes 2  6. Squatting  1  7. Lifting an object, like a bag of groceries from the floor 1  8. Performing light activities around your home 2  9. Performing heavy activities around your home 0  10. Getting into/out of a car 4  11. Walking 2 blocks 1  12. Walking 1 mile 0  13. Going up/down 10 stairs (1 flight) 2  14. Standing for 1 hour 2  15.  sitting for 1 hour 4  16. Running on even ground 1  17. Running on uneven ground 1  18. Making sharp turns while running fast 1  19. Hopping  1  20. Rolling over in bed 4  Score total:  37/80     COGNITION: Overall cognitive status: Within functional limits for tasks assessed     SENSATION: Not tested  POSTURE: No Significant postural limitations  PALPATION: Tenderness of L calf  LOWER EXTREMITY ROM:  Active ROM Right eval Left eval  Hip flexion    Hip extension 5 5  Hip abduction WNL WNL  Hip adduction    Hip internal rotation    Hip external rotation    Knee flexion    Knee  extension    Ankle dorsiflexion 3 3  Ankle plantarflexion WNL WNL  Ankle inversion WNL WNL  Ankle eversion WNL WNL   (Blank rows = not tested)  LOWER EXTREMITY MMT:  MMT Right eval Left eval  Hip flexion    Hip extension 4/5 4/5  Hip abduction 4/5 4/5  Hip adduction    Hip internal rotation    Hip external rotation    Knee flexion    Knee extension    Ankle dorsiflexion 5/5 5/5  Ankle plantarflexion 5/5 5/5  Ankle inversion 5/5 5/5  Ankle eversion 5/5 5/5   (Blank rows = not tested)  FUNCTIONAL TESTS:  Functional squat - tension in L achilles, unsteady  SLS - mod sway B  SL calf raise - able B but unsteady  TREATMENT DATE:   [facility] Adult PT Treatment:                                                DATE: 04/14/2024  Therapeutic Exercise: Seated dynadisc ankle  PF/DF x 30 sec each Inver/eversion x 30 sec each CW/CCW x 30 sec each  Seated LAQ x 10 , unresisted  Calf stretch on wedge   Neuromuscular Education:  Glute Bridges 2 x 10  Supine hip adduction ball squeeze x 10, 5s hold  Supine knee fallout 2 x 10 each side unresisted SLS on floor 2 x 30s each  Tandem stance on airex 2 x 30s each Toe taps to 6 step 2 x 30s each   Need to review at nxt visit:  Retro monster walks     [facility] Adult PT Treatment:                                                DATE: 04/01/2024    Initial evaluation: see patient education and home exercise program as noted below     PATIENT EDUCATION:  Education details: POC, HEP, diagnosis, prognosis. Person educated: Patient Education method: Explanation and Handouts Education comprehension: verbalized understanding and needs further education  HOME EXERCISE PROGRAM: Access Code: [email or web address] URL: [email or web address] Date: 04/01/2024 Prepared by: Marijo Berber  Exercises - Long Sitting Ankle  Plantar Flexion with Resistance  - 1 x daily - 7 x weekly - 3 sets - 10 reps - Seated Heel Raise  - 1 x daily - 7 x weekly - 3 sets - 10 reps - Backward Monster Walks  - 1 x daily - 7 x weekly - 3 sets - 10 reps - Downward Dog  - 1 x daily - 7 x weekly - 1 sets - 10 reps - 10sec hold - Clamshell with Resistance  - 1 x daily - 7 x weekly - 3 sets - 10 reps  ASSESSMENT:  CLINICAL IMPRESSION: 04/14/2024: Patient tolerated initial treatment session well today. She had difficulty, however, with hip adduction exercises, reporting increased hip pain. She has decreased static standing balance and will benefit from additional balance retraining activities.   EVAL: Patient is a 43 y.o. female who was seen today for physical therapy evaluation and treatment for L ankle pain. Pt has good ankle ROM and strength but poor stability in SLS. Pain is present with prolonged walking and squatting. Both activities challenge the pts balance. She is having limitations at work, with recreational activities, and ADLs. The pt will benefit from skilled physical therapy to decrease pain and increase function.    OBJECTIVE IMPAIRMENTS: decreased balance, decreased ROM, decreased strength, hypomobility, impaired flexibility, improper body mechanics, and pain.   ACTIVITY LIMITATIONS: lifting and squatting  PARTICIPATION LIMITATIONS: community activity and occupation  PERSONAL FACTORS: 1-2 comorbidities: Fibromyalgia, HTN are also affecting patient's functional outcome.   REHAB POTENTIAL: Good  CLINICAL DECISION MAKING: Evolving/moderate complexity  EVALUATION COMPLEXITY: Moderate   GOALS: Goals reviewed with patient? Yes  SHORT TERM GOALS: Target date: 04/29/2024 Pt will be compliant and independent with HEP to assist with symptom management/recovery at home.  Baseline: 53JPMY2C Goal status: INITIAL  2.  Pt will demonstrate 1 stable SL calf raise B.  Baseline: unstable  Goal status: INITIAL   LONG TERM  GOALS: Target date: 05/27/2024  Pt will be able to perform 10 squats with 20lbs or more without LOB.  Baseline: unstable without weight Goal status: INITIAL  2.  Pt will demonstrate 4+/5 or greater strength in B hips.  Baseline:  MMT Right eval Left eval  Hip flexion    Hip extension 4/5 4/5  Hip abduction 4/5 4/5   Goal status: INITIAL  3.  Pt will score 50/80 or greater on the LEFS.  Baseline: 37/80 Goal status: INITIAL  4.  Pt will be comfortable with her final HEP in order to continue any symptom management at home and to avoid regression.   Baseline: 53JPMY2C Goal status: INITIAL   PLAN:  PT FREQUENCY: 2x/week  PT DURATION: 8 weeks  PLANNED INTERVENTIONS: 97164- PT Re-evaluation, 97110-Therapeutic exercises, 97530- Therapeutic activity, 97112- Neuromuscular re-education, 97535- Self Care, [id number]- Manual therapy, [id number]- Electrical stimulation (unattended), 20560 (1-2 muscles), 20561 (3+ muscles)- Dry Needling, Patient/Family education, Balance training, Taping, Joint mobilization, Joint manipulation, Cryotherapy, and Moist heat  PLAN FOR NEXT SESSION: ankle and hip stability, hip and ankle strengthening, SL activities, stretching for calf. HEP review.    Marko Molt, PT, DPT  04/14/2024 6:35 PM

## 2024-04-15 ENCOUNTER — Encounter: Payer: Self-pay | Admitting: Podiatry

## 2024-04-15 ENCOUNTER — Ambulatory Visit: Admitting: Podiatry

## 2024-04-15 DIAGNOSIS — M7662 Achilles tendinitis, left leg: Secondary | ICD-10-CM

## 2024-04-15 NOTE — Progress Notes (Signed)
°  Subjective:  Patient ID: Emily Ochoa, female    DOB: [birth year],  MRN: [id number]  Chief Complaint  Patient presents with   Foot Pain    L foot is feeling much better doing stretches,and started physical therapy.  Trying to wear boot more.     Discussed the use of AI scribe software for clinical note transcription with the patient, who gave verbal consent to proceed.  History of Present Illness Emily Ochoa is a 43 year old female with left Achilles tendinitis who presents for follow-up of her ongoing recovery and physical therapy.  She began formal physical therapy for left Achilles tendinitis yesterday after an initial assessment one week ago that caused significant soreness for 3 to 4 days, which improved with rest and limited activity.  Since a moderate workout at physical therapy yesterday, she has had no pain. She does daily stretching and occasionally feels Achilles tightness that resolves with exercise. She denies heel pain other than mild tightness-related discomfort that improves with stretching and notes improved ankle range of motion.  She continues to use a walking boot but has difficulty wearing it at work on an [employer] surface that previously worsened symptoms.  She does work at a daycare.  She uses ibuprofen  as needed along with icing and stretching for swelling and pain on recovery days and is in therapy focused on lower extremity and pelvic alignment.      Objective:    Physical Exam CARDIOVASCULAR: DP and PT pulses intact. MUSCULOSKELETAL: No pain on palpation of left Achilles tendon or insertion. Mild discomfort on palpation of plantar central heel, no significant pain or inflammation. Improving ankle joint range of motion with knee extended, approximately ten degrees past neutral. NEUROLOGICAL: Light touch and protective sensation intact. SKIN: Normal skin texture and turgor.   No images are attached to the encounter.    Results     Assessment:   1. Calcific Achilles tendinitis of left lower extremity      Plan:  Patient was evaluated and treated and all questions answered.  Assessment and Plan Assessment & Plan Calcific Achilles tendinitis of left lower extremity Subacute, improving with conservative management. No significant pain or inflammation. Ankle range of motion improving. - Continue walking boot for two weeks, then transition to regular footwear over one to two weeks as tolerated. - Use one to two layers of felt or adjustable heel lifts in both regular shoes and boot, discontinue after one month if symptoms improve. - Provided two heel pads for use in shoes or boot. - Continue daily stretching and physical therapy exercises. - Ibuprofen  as needed for swelling or pain. - Ice and stretch on recovery days as needed. - Follow-up in six weeks to reassess progress, option to cancel if asymptomatic and at baseline. - Provided guidance on gradual transition out of boot and use of heel lifts.      Return in about 6 weeks (around 05/27/2024) for Achilles tendonitis.

## 2024-04-16 ENCOUNTER — Ambulatory Visit

## 2024-04-16 DIAGNOSIS — M7660 Achilles tendinitis, unspecified leg: Secondary | ICD-10-CM

## 2024-04-16 DIAGNOSIS — M7662 Achilles tendinitis, left leg: Secondary | ICD-10-CM | POA: Diagnosis not present

## 2024-04-16 NOTE — Therapy (Signed)
 " OUTPATIENT PHYSICAL THERAPY NOTE   Patient Name: Emily Ochoa MRN: [id number] DOB:[birth year], 43 y.o., female Today's Date: 04/16/2024  END OF SESSION:  PT End of Session - 04/16/24 1313     Visit Number 3    Number of Visits 16    Date for Recertification  05/27/24    Authorization Type [employer] DUAL COMPLETE/ MCD    Authorization Time Period no auth required    PT Start Time 0105    PT Stop Time 0140    PT Time Calculation (min) 35 min            Past Medical History:  Diagnosis Date   Abdominal pain 11/20/2011   Atypical facial pain 11/20/2011   Backache    Bulging lumbar disc 11/03/2014   Cardiac dysrhythmia 08/12/2011   Carpal tunnel syndrome 11/20/2011   Cholecystitis 06/16/2016   Diplopia 11/20/2011   Edema 11/20/2011   Elevated blood sugar 11/03/2014   Essential hypertension 08/12/2011   Fibromyalgia    Gastroesophageal reflux disease 11/04/2014   Gout 11/20/2011   Herpes simplex 11/20/2011   Iron deficiency anemia secondary to inadequate dietary iron intake 11/20/2011   Lytic lesion of bone on x-ray 05/17/2020   Major depressive disorder 11/14/2021   Malaise and fatigue 11/20/2011   Migraine headache    Mild cognitive impairment 11/27/2021   Morbid obesity with BMI of 60.0-69.9, adult 11/14/2021   Myalgia 11/20/2011   Nausea with vomiting 11/20/2011   Palpitations 08/30/2011   Pruritus of genitalia 11/20/2011   Shortness of breath 08/30/2011   URI (upper respiratory infection)    Past Surgical History:  Procedure Laterality Date   BACK SURGERY     bioposy in 2009   CHOLECYSTECTOMY     Patient Active Problem List   Diagnosis Date Noted   Fibromyalgia 11/27/2021   Mild cognitive impairment 11/27/2021   Backache    Migraine headache    Morbid obesity with BMI of 60.0-69.9, adult 11/14/2021   Major depressive disorder 11/14/2021   Lytic lesion of bone on x-ray 05/17/2020   Cholecystitis 06/16/2016   Gastroesophageal reflux disease 11/04/2014    Elevated blood sugar 11/03/2014   Bulging lumbar disc 11/03/2014   Atypical facial pain 11/20/2011   Carpal tunnel syndrome 11/20/2011   Diplopia 11/20/2011   Edema 11/20/2011   Gout 11/20/2011   Herpes simplex 11/20/2011   Iron deficiency anemia secondary to inadequate dietary iron intake 11/20/2011   Malaise and fatigue 11/20/2011   Myalgia 11/20/2011   Pruritus of genitalia 11/20/2011   Palpitations 08/30/2011   Shortness of breath 08/30/2011   Cardiac dysrhythmia 08/12/2011   Essential hypertension 08/12/2011    PCP: Missy Reusing, PA-C  REFERRING PROVIDER: Lamount Ethan CROME, DPM  REFERRING DIAG: [phone] (ICD-10-CM) - Calcific Achilles tendinitis of left lower extremity  THERAPY DIAG:  Achilles tendon pain  Rationale for Evaluation and Treatment: Rehabilitation  ONSET DATE: 2 months ago  SUBJECTIVE:   SUBJECTIVE STATEMENT: 04/16/2024 patient reports mild soreness after last session. She saw her podiatrist who advised 2 more weeks of the boot then transition off. Pt arrived without the boot on today.   EVAL: Pt reports her pain began 2 months ago after going to the gym. She reports having lost weight and beginning going to the gym/running since earlier this year and suddenly could not put her foot down. Pt notes no trauma to the ankle/foot. She was put into a boot which she wore for about a month. Her pain has improved greatly  but she is not back to her gym routine. The podiatrist wants the pt to continue use of the boot but the pt reports L hip pain when wearing it. Pt is having pain with prolonged walking, squatting, running, and use of the elliptical.  She has a history of plantar fasciitis.    PERTINENT HISTORY: Fibromyalgia, HTN PAIN:  Are you having pain? Yes: NPRS scale: 0/10; 4/10 worst; 0/10 best Pain location: L achilles Pain description: tension Aggravating factors: squats, lots of walking, running, elliptical  Relieving factors: ice,  stretches  PRECAUTIONS: None  RED FLAGS: None   WEIGHT BEARING RESTRICTIONS: No  FALLS:  Has patient fallen in last 6 months? No  LIVING ENVIRONMENT: Lives with: lives alone Lives in: House/apartment Stairs: No Has following equipment at home: None  OCCUPATION: daycare  PLOF: Independent  PATIENT GOALS: return to the gym   NEXT MD VISIT: 12/181/2025  OBJECTIVE:  Note: Objective measures were completed at Evaluation unless otherwise noted.  PATIENT SURVEYS:  LEFS  Extreme difficulty/unable (0), Quite a bit of difficulty (1), Moderate difficulty (2), Little difficulty (3), No difficulty (4) Survey date:  04/01/2024   Any of your usual work, housework or school activities 2  2. Usual hobbies, recreational or sporting activities 2  3. Getting into/out of the bath 4  4. Walking between rooms 2  5. Putting on socks/shoes 2  6. Squatting  1  7. Lifting an object, like a bag of groceries from the floor 1  8. Performing light activities around your home 2  9. Performing heavy activities around your home 0  10. Getting into/out of a car 4  11. Walking 2 blocks 1  12. Walking 1 mile 0  13. Going up/down 10 stairs (1 flight) 2  14. Standing for 1 hour 2  15.  sitting for 1 hour 4  16. Running on even ground 1  17. Running on uneven ground 1  18. Making sharp turns while running fast 1  19. Hopping  1  20. Rolling over in bed 4  Score total:  37/80     COGNITION: Overall cognitive status: Within functional limits for tasks assessed     SENSATION: Not tested  POSTURE: No Significant postural limitations  PALPATION: Tenderness of L calf  LOWER EXTREMITY ROM:  Active ROM Right eval Left eval  Hip flexion    Hip extension 5 5  Hip abduction WNL WNL  Hip adduction    Hip internal rotation    Hip external rotation    Knee flexion    Knee extension    Ankle dorsiflexion 3 3  Ankle plantarflexion WNL WNL  Ankle inversion WNL WNL  Ankle eversion WNL WNL    (Blank rows = not tested)  LOWER EXTREMITY MMT:  MMT Right eval Left eval  Hip flexion    Hip extension 4/5 4/5  Hip abduction 4/5 4/5  Hip adduction    Hip internal rotation    Hip external rotation    Knee flexion    Knee extension    Ankle dorsiflexion 5/5 5/5  Ankle plantarflexion 5/5 5/5  Ankle inversion 5/5 5/5  Ankle eversion 5/5 5/5   (Blank rows = not tested)  FUNCTIONAL TESTS:  Functional squat - tension in L achilles, unsteady  SLS - mod sway B  SL calf raise - able B but unsteady  TREATMENT DATE:   [facility] Adult PT Treatment:                                                DATE: 04/16/2024  Therapeutic Exercise: Seated dynadisc ankle  PF/DF x 30 sec each Inver/eversion x 30 sec each CW/CCW x 30 sec each  Calf stretch on wedge   Neuromuscular Education:  Glute Bridges 3 x 10, second + third set with RTB  S/L hip ABD 2x10 B  Retro monster walk RTB x 2 laps  SLS on floor 2 x 30s each  Tandem stance on airex 2 x 30s each   [facility] Adult PT Treatment:                                                DATE: 04/01/2024    Initial evaluation: see patient education and home exercise program as noted below     PATIENT EDUCATION:  Education details: POC, HEP, diagnosis, prognosis. Person educated: Patient Education method: Explanation and Handouts Education comprehension: verbalized understanding and needs further education  HOME EXERCISE PROGRAM: Access Code: [email or web address] URL: [email or web address] Date: 04/01/2024 Prepared by: Marijo Berber  Exercises - Long Sitting Ankle Plantar Flexion with Resistance  - 1 x daily - 7 x weekly - 3 sets - 10 reps - Seated Heel Raise  - 1 x daily - 7 x weekly - 3 sets - 10 reps - Backward Monster Walks  - 1 x daily - 7 x weekly - 3 sets - 10 reps - Downward Dog  - 1 x daily - 7 x weekly - 1  sets - 10 reps - 10sec hold - Clamshell with Resistance  - 1 x daily - 7 x weekly - 3 sets - 10 reps  ASSESSMENT:  CLINICAL IMPRESSION: 04/16/2024: pt guided through hip strengthening and balance activities with good response. Retro monster walks were demonstrated so that pt can perform at home with correct form. SLS was progressed to foam with good hold time. The pt will benefit from skilled physical therapy to decrease pain and increase function.    EVAL: Patient is a 43 y.o. female who was seen today for physical therapy evaluation and treatment for L ankle pain. Pt has good ankle ROM and strength but poor stability in SLS. Pain is present with prolonged walking and squatting. Both activities challenge the pts balance. She is having limitations at work, with recreational activities, and ADLs. The pt will benefit from skilled physical therapy to decrease pain and increase function.    OBJECTIVE IMPAIRMENTS: decreased balance, decreased ROM, decreased strength, hypomobility, impaired flexibility, improper body mechanics, and pain.   ACTIVITY LIMITATIONS: lifting and squatting  PARTICIPATION LIMITATIONS: community activity and occupation  PERSONAL FACTORS: 1-2 comorbidities: Fibromyalgia, HTN are also affecting patient's functional outcome.   REHAB POTENTIAL: Good  CLINICAL DECISION MAKING: Evolving/moderate complexity  EVALUATION COMPLEXITY: Moderate   GOALS: Goals reviewed with patient? Yes  SHORT TERM GOALS: Target date: 04/29/2024 Pt will be compliant and independent with HEP to assist with symptom management/recovery at home.  Baseline: 53JPMY2C Goal status: INITIAL  2.  Pt will demonstrate 1 stable SL calf raise B.  Baseline: unstable  Goal status: INITIAL   LONG TERM GOALS:  Target date: 05/27/2024  Pt will be able to perform 10 squats with 20lbs or more without LOB.  Baseline: unstable without weight Goal status: INITIAL  2.  Pt will demonstrate 4+/5 or greater  strength in B hips.  Baseline:  MMT Right eval Left eval  Hip flexion    Hip extension 4/5 4/5  Hip abduction 4/5 4/5   Goal status: INITIAL  3.  Pt will score 50/80 or greater on the LEFS.  Baseline: 37/80 Goal status: INITIAL  4.  Pt will be comfortable with her final HEP in order to continue any symptom management at home and to avoid regression.   Baseline: 53JPMY2C Goal status: INITIAL   PLAN:  PT FREQUENCY: 2x/week  PT DURATION: 8 weeks  PLANNED INTERVENTIONS: 97164- PT Re-evaluation, 97110-Therapeutic exercises, 97530- Therapeutic activity, 97112- Neuromuscular re-education, 97535- Self Care, 02859- Manual therapy, G0283- Electrical stimulation (unattended), 20560 (1-2 muscles), 20561 (3+ muscles)- Dry Needling, Patient/Family education, Balance training, Taping, Joint mobilization, Joint manipulation, Cryotherapy, and Moist heat  PLAN FOR NEXT SESSION: ankle and hip stability, hip and ankle strengthening, SL activities, stretching for calf. HEP review.   Marijo Berber PT, DPT 04/16/2024 1:22 PM  "

## 2024-04-20 ENCOUNTER — Ambulatory Visit

## 2024-04-20 DIAGNOSIS — M7660 Achilles tendinitis, unspecified leg: Secondary | ICD-10-CM

## 2024-04-20 DIAGNOSIS — M7662 Achilles tendinitis, left leg: Secondary | ICD-10-CM | POA: Diagnosis not present

## 2024-04-20 NOTE — Therapy (Signed)
 " OUTPATIENT PHYSICAL THERAPY NOTE   Patient Name: Emily Ochoa MRN: [id number] DOB:1980/08/16, 43 y.o., female Today's Date: 04/20/2024  END OF SESSION:  PT End of Session - 04/20/24 1359     Visit Number 4    Number of Visits 16    Date for Recertification  05/27/24    Authorization Type [employer] DUAL COMPLETE/ MCD    Authorization Time Period no auth required    PT Start Time 1400    PT Stop Time 1438    PT Time Calculation (min) 38 min    Activity Tolerance Patient tolerated treatment well    Behavior During Therapy [facility] for tasks assessed/performed            Past Medical History:  Diagnosis Date   Abdominal pain 11/20/2011   Atypical facial pain 11/20/2011   Backache    Bulging lumbar disc 11/03/2014   Cardiac dysrhythmia 08/12/2011   Carpal tunnel syndrome 11/20/2011   Cholecystitis 06/16/2016   Diplopia 11/20/2011   Edema 11/20/2011   Elevated blood sugar 11/03/2014   Essential hypertension 08/12/2011   Fibromyalgia    Gastroesophageal reflux disease 11/04/2014   Gout 11/20/2011   Herpes simplex 11/20/2011   Iron deficiency anemia secondary to inadequate dietary iron intake 11/20/2011   Lytic lesion of bone on x-ray 05/17/2020   Major depressive disorder 11/14/2021   Malaise and fatigue 11/20/2011   Migraine headache    Mild cognitive impairment 11/27/2021   Morbid obesity with BMI of 60.0-69.9, adult 11/14/2021   Myalgia 11/20/2011   Nausea with vomiting 11/20/2011   Palpitations 08/30/2011   Pruritus of genitalia 11/20/2011   Shortness of breath 08/30/2011   URI (upper respiratory infection)    Past Surgical History:  Procedure Laterality Date   BACK SURGERY     bioposy in 2009   CHOLECYSTECTOMY     Patient Active Problem List   Diagnosis Date Noted   Fibromyalgia 11/27/2021   Mild cognitive impairment 11/27/2021   Backache    Migraine headache    Morbid obesity with BMI of 60.0-69.9, adult 11/14/2021   Major depressive disorder 11/14/2021    Lytic lesion of bone on x-ray 05/17/2020   Cholecystitis 06/16/2016   Gastroesophageal reflux disease 11/04/2014   Elevated blood sugar 11/03/2014   Bulging lumbar disc 11/03/2014   Atypical facial pain 11/20/2011   Carpal tunnel syndrome 11/20/2011   Diplopia 11/20/2011   Edema 11/20/2011   Gout 11/20/2011   Herpes simplex 11/20/2011   Iron deficiency anemia secondary to inadequate dietary iron intake 11/20/2011   Malaise and fatigue 11/20/2011   Myalgia 11/20/2011   Pruritus of genitalia 11/20/2011   Palpitations 08/30/2011   Shortness of breath 08/30/2011   Cardiac dysrhythmia 08/12/2011   Essential hypertension 08/12/2011    PCP: Missy Reusing, PA-C  REFERRING PROVIDER: Lamount Ethan CROME, DPM  REFERRING DIAG: [phone] (ICD-10-CM) - 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   EVAL: Pt reports her pain began 2 months ago after going to the gym. She reports having lost weight and beginning going to the gym/running since earlier this year and suddenly could not put her foot down. Pt notes no trauma to the ankle/foot. She was put into a boot which she wore for about a month. Her  pain has improved greatly but she is not back to her gym routine. The podiatrist wants the pt to continue use of the boot but the pt reports L hip pain when wearing it. Pt is having pain with prolonged walking, squatting, running, and use of the elliptical.  She has a history of plantar fasciitis.    PERTINENT HISTORY: Fibromyalgia, HTN PAIN:  Are you having pain? Yes: NPRS scale: 0/10; 4/10 worst; 0/10 best Pain location: L achilles Pain description: tension Aggravating factors: squats, lots of walking, running, elliptical  Relieving factors:  ice, stretches  PRECAUTIONS: None  RED FLAGS: None   WEIGHT BEARING RESTRICTIONS: No  FALLS:  Has patient fallen in last 6 months? No  LIVING ENVIRONMENT: Lives with: lives alone Lives in: House/apartment Stairs: No Has following equipment at home: None  OCCUPATION: daycare  PLOF: Independent  PATIENT GOALS: return to the gym   NEXT MD VISIT: 12/181/2025  OBJECTIVE:  Note: Objective measures were completed at Evaluation unless otherwise noted.  PATIENT SURVEYS:  LEFS  Extreme difficulty/unable (0), Quite a bit of difficulty (1), Moderate difficulty (2), Little difficulty (3), No difficulty (4) Survey date:  04/01/2024   Any of your usual work, housework or school activities 2  2. Usual hobbies, recreational or sporting activities 2  3. Getting into/out of the bath 4  4. Walking between rooms 2  5. Putting on socks/shoes 2  6. Squatting  1  7. Lifting an object, like a bag of groceries from the floor 1  8. Performing light activities around your home 2  9. Performing heavy activities around your home 0  10. Getting into/out of a car 4  11. Walking 2 blocks 1  12. Walking 1 mile 0  13. Going up/down 10 stairs (1 flight) 2  14. Standing for 1 hour 2  15.  sitting for 1 hour 4  16. Running on even ground 1  17. Running on uneven ground 1  18. Making sharp turns while running fast 1  19. Hopping  1  20. Rolling over in bed 4  Score total:  37/80     COGNITION: Overall cognitive status: Within functional limits for tasks assessed     SENSATION: Not tested  POSTURE: No Significant postural limitations  PALPATION: Tenderness of L calf  LOWER EXTREMITY ROM:  Active ROM Right eval Left eval  Hip flexion    Hip extension 5 5  Hip abduction WNL WNL  Hip adduction    Hip internal rotation    Hip external rotation    Knee flexion    Knee extension    Ankle dorsiflexion 3 3  Ankle plantarflexion WNL WNL  Ankle inversion WNL WNL  Ankle eversion WNL  WNL   (Blank rows = not tested)  LOWER EXTREMITY MMT:  MMT Right eval Left eval  Hip flexion    Hip extension 4/5 4/5  Hip abduction 4/5 4/5  Hip adduction    Hip internal rotation    Hip external rotation    Knee flexion    Knee extension    Ankle dorsiflexion 5/5 5/5  Ankle plantarflexion 5/5 5/5  Ankle inversion 5/5 5/5  Ankle eversion 5/5 5/5   (Blank rows = not tested)  FUNCTIONAL TESTS:  Functional squat - tension in L achilles, unsteady  SLS - mod sway B  SL calf raise - able B but unsteady  TREATMENT DATE:   [facility] Adult PT Treatment:                                                DATE: 04/20/2024  Therapeutic Exercise: DKTC with ball 2x10  LTR with ball x 10 B  Supine piriformis stretch 2x30 B  Seated ball roll out x 10 all directions  Seated dynadisc ankle  PF/DF x 30 sec each Inver/eversion x 30 sec each CW/CCW x 30 sec each  Calf stretch on wedge   Neuromuscular Education:  Bridges with legs extended on ball 2x7-10  S/L hip ABD 2x10 B  Heel raises at counter 2x10    [facility] Adult PT Treatment:                                                DATE: 04/01/2024    Initial evaluation: see patient education and home exercise program as noted below     PATIENT EDUCATION:  Education details: POC, HEP, diagnosis, prognosis. Person educated: Patient Education method: Explanation and Handouts Education comprehension: verbalized understanding and needs further education  HOME EXERCISE PROGRAM: Access Code: [email or web address] URL: [email or web address] Date: 04/01/2024 Prepared by: Marijo Berber  Exercises - Long Sitting Ankle Plantar Flexion with Resistance  - 1 x daily - 7 x weekly - 3 sets - 10 reps - Seated Heel Raise  - 1 x daily - 7 x weekly - 3 sets - 10 reps - Backward Monster Walks  - 1 x daily - 7 x weekly - 3 sets -  10 reps - Downward Dog  - 1 x daily - 7 x weekly - 1 sets - 10 reps - 10sec hold - Clamshell with Resistance  - 1 x daily - 7 x weekly - 3 sets - 10 reps  ASSESSMENT:  CLINICAL IMPRESSION: 04/20/2024: today's session focused more on mobility and gentle strengthening due to fibro flare up. Pt tolerable to all exercises on this date. Heel raises were added today to assist with balance activities.  The pt will benefit from skilled physical therapy to decrease pain and increase function.    EVAL: Patient is a 43 y.o. female who was seen today for physical therapy evaluation and treatment for L ankle pain. Pt has good ankle ROM and strength but poor stability in SLS. Pain is present with prolonged walking and squatting. Both activities challenge the pts balance. She is having limitations at work, with recreational activities, and ADLs. The pt will benefit from skilled physical therapy to decrease pain and increase function.    OBJECTIVE IMPAIRMENTS: decreased balance, decreased ROM, decreased strength, hypomobility, impaired flexibility, improper body mechanics, and pain.   ACTIVITY LIMITATIONS: lifting and squatting  PARTICIPATION LIMITATIONS: community activity and occupation  PERSONAL FACTORS: 1-2 comorbidities: Fibromyalgia, HTN are also affecting patient's functional outcome.   REHAB POTENTIAL: Good  CLINICAL DECISION MAKING: Evolving/moderate complexity  EVALUATION COMPLEXITY: Moderate   GOALS: Goals reviewed with patient? Yes  SHORT TERM GOALS: Target date: 04/29/2024 Pt will be compliant and independent with HEP to assist with symptom management/recovery at home.  Baseline: 53JPMY2C Goal status: INITIAL  2.  Pt will demonstrate 1 stable SL calf raise B.  Baseline: unstable  Goal status: INITIAL  LONG TERM GOALS: Target date: 05/27/2024  Pt will be able to perform 10 squats with 20lbs or more without LOB.  Baseline: unstable without weight Goal status: INITIAL  2.  Pt  will demonstrate 4+/5 or greater strength in B hips.  Baseline:  MMT Right eval Left eval  Hip flexion    Hip extension 4/5 4/5  Hip abduction 4/5 4/5   Goal status: INITIAL  3.  Pt will score 50/80 or greater on the LEFS.  Baseline: 37/80 Goal status: INITIAL  4.  Pt will be comfortable with her final HEP in order to continue any symptom management at home and to avoid regression.   Baseline: 53JPMY2C Goal status: INITIAL   PLAN:  PT FREQUENCY: 2x/week  PT DURATION: 8 weeks  PLANNED INTERVENTIONS: 97164- PT Re-evaluation, 97110-Therapeutic exercises, 97530- Therapeutic activity, 97112- Neuromuscular re-education, 97535- Self Care, 02859- Manual therapy, G0283- Electrical stimulation (unattended), 20560 (1-2 muscles), 20561 (3+ muscles)- Dry Needling, Patient/Family education, Balance training, Taping, Joint mobilization, Joint manipulation, Cryotherapy, and Moist heat  PLAN FOR NEXT SESSION: ankle and hip stability, hip and ankle strengthening, SL activities, stretching for calf. HEP review.   Marijo Berber PT, DPT 04/20/2024 2:24 PM  "

## 2024-04-23 ENCOUNTER — Ambulatory Visit

## 2024-04-27 ENCOUNTER — Ambulatory Visit

## 2024-04-30 ENCOUNTER — Telehealth: Payer: Self-pay | Admitting: Lab

## 2024-04-30 ENCOUNTER — Ambulatory Visit: Attending: Podiatry

## 2024-04-30 DIAGNOSIS — M25672 Stiffness of left ankle, not elsewhere classified: Secondary | ICD-10-CM | POA: Insufficient documentation

## 2024-04-30 DIAGNOSIS — M25572 Pain in left ankle and joints of left foot: Secondary | ICD-10-CM | POA: Diagnosis not present

## 2024-04-30 DIAGNOSIS — R2689 Other abnormalities of gait and mobility: Secondary | ICD-10-CM | POA: Insufficient documentation

## 2024-04-30 DIAGNOSIS — M6281 Muscle weakness (generalized): Secondary | ICD-10-CM | POA: Insufficient documentation

## 2024-04-30 DIAGNOSIS — M25872 Other specified joint disorders, left ankle and foot: Secondary | ICD-10-CM | POA: Diagnosis not present

## 2024-04-30 DIAGNOSIS — M7660 Achilles tendinitis, unspecified leg: Secondary | ICD-10-CM | POA: Diagnosis present

## 2024-04-30 NOTE — Telephone Encounter (Signed)
"  Patient calling for pain medication refill.  "

## 2024-04-30 NOTE — Therapy (Signed)
 " OUTPATIENT PHYSICAL THERAPY NOTE   Patient Name: Emily Ochoa MRN: [id number] DOB:[birth year], 44 y.o., female Today's Date: 04/30/2024  END OF SESSION:  PT End of Session - 04/30/24 1006     Visit Number 5    Number of Visits 16    Date for Recertification  05/27/24    Authorization Type [employer] DUAL COMPLETE/ MCD    Authorization Time Period no auth required    Progress Note Due on Visit 10    PT Start Time 1006    PT Stop Time 1044    PT Time Calculation (min) 38 min    Activity Tolerance Patient tolerated treatment well    Behavior During Therapy WFL for tasks assessed/performed            Past Medical History:  Diagnosis Date   Abdominal pain 11/20/2011   Atypical facial pain 11/20/2011   Backache    Bulging lumbar disc 11/03/2014   Cardiac dysrhythmia 08/12/2011   Carpal tunnel syndrome 11/20/2011   Cholecystitis 06/16/2016   Diplopia 11/20/2011   Edema 11/20/2011   Elevated blood sugar 11/03/2014   Essential hypertension 08/12/2011   Fibromyalgia    Gastroesophageal reflux disease 11/04/2014   Gout 11/20/2011   Herpes simplex 11/20/2011   Iron deficiency anemia secondary to inadequate dietary iron intake 11/20/2011   Lytic lesion of bone on x-ray 05/17/2020   Major depressive disorder 11/14/2021   Malaise and fatigue 11/20/2011   Migraine headache    Mild cognitive impairment 11/27/2021   Morbid obesity with BMI of 60.0-69.9, adult 11/14/2021   Myalgia 11/20/2011   Nausea with vomiting 11/20/2011   Palpitations 08/30/2011   Pruritus of genitalia 11/20/2011   Shortness of breath 08/30/2011   URI (upper respiratory infection)    Past Surgical History:  Procedure Laterality Date   BACK SURGERY     bioposy in 2009   CHOLECYSTECTOMY     Patient Active Problem List   Diagnosis Date Noted   Fibromyalgia 11/27/2021   Mild cognitive impairment 11/27/2021   Backache    Migraine headache    Morbid obesity with BMI of 60.0-69.9, adult 11/14/2021   Major  depressive disorder 11/14/2021   Lytic lesion of bone on x-ray 05/17/2020   Cholecystitis 06/16/2016   Gastroesophageal reflux disease 11/04/2014   Elevated blood sugar 11/03/2014   Bulging lumbar disc 11/03/2014   Atypical facial pain 11/20/2011   Carpal tunnel syndrome 11/20/2011   Diplopia 11/20/2011   Edema 11/20/2011   Gout 11/20/2011   Herpes simplex 11/20/2011   Iron deficiency anemia secondary to inadequate dietary iron intake 11/20/2011   Malaise and fatigue 11/20/2011   Myalgia 11/20/2011   Pruritus of genitalia 11/20/2011   Palpitations 08/30/2011   Shortness of breath 08/30/2011   Cardiac dysrhythmia 08/12/2011   Essential hypertension 08/12/2011    PCP: Missy Reusing, PA-C  REFERRING PROVIDER: Lamount Ethan CROME, DPM  REFERRING DIAG: [id number] (ICD-10-CM) - Calcific Achilles tendinitis of left lower extremity  THERAPY DIAG:  Achilles tendon pain  Rationale for Evaluation and Treatment: Rehabilitation  ONSET DATE: 2 months ago  SUBJECTIVE:   SUBJECTIVE STATEMENT: 04/30/2024 patient reports 3-4/10 pain of the ankle. She arrives with the boot on and explained that she overdid it recently so now her ankle is bothered.   EVAL: Pt reports her pain began 2 months ago after going to the gym. She reports having lost weight and beginning going to the gym/running since earlier this year and suddenly could not put her foot down.  Pt notes no trauma to the ankle/foot. She was put into a boot which she wore for about a month. Her pain has improved greatly but she is not back to her gym routine. The podiatrist wants the pt to continue use of the boot but the pt reports L hip pain when wearing it. Pt is having pain with prolonged walking, squatting, running, and use of the elliptical.  She has a history of plantar fasciitis.    PERTINENT HISTORY: Fibromyalgia, HTN PAIN:  Are you having pain? Yes: NPRS scale: 0/10; 4/10 worst; 0/10 best Pain location: L achilles Pain description:  tension Aggravating factors: squats, lots of walking, running, elliptical  Relieving factors: ice, stretches  PRECAUTIONS: None  RED FLAGS: None   WEIGHT BEARING RESTRICTIONS: No  FALLS:  Has patient fallen in last 6 months? No  LIVING ENVIRONMENT: Lives with: lives alone Lives in: House/apartment Stairs: No Has following equipment at home: None  OCCUPATION: daycare  PLOF: Independent  PATIENT GOALS: return to the gym   NEXT MD VISIT: 12/181/2025  OBJECTIVE:  Note: Objective measures were completed at Evaluation unless otherwise noted.  PATIENT SURVEYS:  LEFS  Extreme difficulty/unable (0), Quite a bit of difficulty (1), Moderate difficulty (2), Little difficulty (3), No difficulty (4) Survey date:  04/01/2024   Any of your usual work, housework or school activities 2  2. Usual hobbies, recreational or sporting activities 2  3. Getting into/out of the bath 4  4. Walking between rooms 2  5. Putting on socks/shoes 2  6. Squatting  1  7. Lifting an object, like a bag of groceries from the floor 1  8. Performing light activities around your home 2  9. Performing heavy activities around your home 0  10. Getting into/out of a car 4  11. Walking 2 blocks 1  12. Walking 1 mile 0  13. Going up/down 10 stairs (1 flight) 2  14. Standing for 1 hour 2  15.  sitting for 1 hour 4  16. Running on even ground 1  17. Running on uneven ground 1  18. Making sharp turns while running fast 1  19. Hopping  1  20. Rolling over in bed 4  Score total:  37/80     COGNITION: Overall cognitive status: Within functional limits for tasks assessed     SENSATION: Not tested  POSTURE: No Significant postural limitations  PALPATION: Tenderness of L calf  LOWER EXTREMITY ROM:  Active ROM Right eval Left eval  Hip flexion    Hip extension 5 5  Hip abduction WNL WNL  Hip adduction    Hip internal rotation    Hip external rotation    Knee flexion    Knee extension    Ankle  dorsiflexion 3 3  Ankle plantarflexion WNL WNL  Ankle inversion WNL WNL  Ankle eversion WNL WNL   (Blank rows = not tested)  LOWER EXTREMITY MMT:  MMT Right eval Left eval  Hip flexion    Hip extension 4/5 4/5  Hip abduction 4/5 4/5  Hip adduction    Hip internal rotation    Hip external rotation    Knee flexion    Knee extension    Ankle dorsiflexion 5/5 5/5  Ankle plantarflexion 5/5 5/5  Ankle inversion 5/5 5/5  Ankle eversion 5/5 5/5   (Blank rows = not tested)  FUNCTIONAL TESTS:  Functional squat - tension in L achilles, unsteady  SLS - mod sway B  SL calf raise - able  B but unsteady                                                                                                                               TREATMENT DATE:   [facility] Adult PT Treatment:                                                DATE: 04/30/2024  Therapeutic Exercise: Bridge with RTB x 15  Clam shell RTB x 15 B  TRX squats 2x8-10 Heel raises x 15  Slant board stretch 3x30   Modalities:  Game Ready to L ankle x 10 mins    [facility] Adult PT Treatment:                                                DATE: 04/01/2024    Initial evaluation: see patient education and home exercise program as noted below     PATIENT EDUCATION:  Education details: POC, HEP, diagnosis, prognosis. Person educated: Patient Education method: Explanation and Handouts Education comprehension: verbalized understanding and needs further education  HOME EXERCISE PROGRAM: Access Code: [email or web address] URL: [email or web address] Date: 04/01/2024 Prepared by: Marijo Berber  Exercises - Long Sitting Ankle Plantar Flexion with Resistance  - 1 x daily - 7 x weekly - 3 sets - 10 reps - Seated Heel Raise  - 1 x daily - 7 x weekly - 3 sets - 10 reps - Backward Monster Walks  - 1 x daily - 7 x weekly - 3 sets - 10 reps - Downward Dog  - 1 x daily - 7 x weekly - 1 sets - 10 reps - 10sec hold - Clamshell with Resistance  - 1  x daily - 7 x weekly - 3 sets - 10 reps  ASSESSMENT:  CLINICAL IMPRESSION: 04/30/2024: pt able to perform all exercises without increase in pain. She notes feeling better after using the Game Ready. Pt advised to continue HEP.   The pt will benefit from skilled physical therapy to decrease pain and increase function.    EVAL: Patient is a 44 y.o. female who was seen today for physical therapy evaluation and treatment for L ankle pain. Pt has good ankle ROM and strength but poor stability in SLS. Pain is present with prolonged walking and squatting. Both activities challenge the pts balance. She is having limitations at work, with recreational activities, and ADLs. The pt will benefit from skilled physical therapy to decrease pain and increase function.    OBJECTIVE IMPAIRMENTS: decreased balance, decreased ROM, decreased strength, hypomobility, impaired flexibility, improper body mechanics, and pain.   ACTIVITY LIMITATIONS: lifting and squatting  PARTICIPATION LIMITATIONS: community activity and occupation  PERSONAL FACTORS: 1-2 comorbidities: Fibromyalgia, HTN are also affecting patient's functional outcome.   REHAB POTENTIAL: Good  CLINICAL DECISION MAKING: Evolving/moderate complexity  EVALUATION COMPLEXITY: Moderate   GOALS: Goals reviewed with patient? Yes  SHORT TERM GOALS: Target date: 04/29/2024 Pt will be compliant and independent with HEP to assist with symptom management/recovery at home.  Baseline: 53JPMY2C Goal status: INITIAL  2.  Pt will demonstrate 1 stable SL calf raise B.  Baseline: unstable  Goal status: INITIAL   LONG TERM GOALS: Target date: 05/27/2024  Pt will be able to perform 10 squats with 20lbs or more without LOB.  Baseline: unstable without weight Goal status: INITIAL  2.  Pt will demonstrate 4+/5 or greater strength in B hips.  Baseline:  MMT Right eval Left eval  Hip flexion    Hip extension 4/5 4/5  Hip abduction 4/5 4/5   Goal  status: INITIAL  3.  Pt will score 50/80 or greater on the LEFS.  Baseline: 37/80 Goal status: INITIAL  4.  Pt will be comfortable with her final HEP in order to continue any symptom management at home and to avoid regression.   Baseline: 53JPMY2C Goal status: INITIAL   PLAN:  PT FREQUENCY: 2x/week  PT DURATION: 8 weeks  PLANNED INTERVENTIONS: 97164- PT Re-evaluation, 97110-Therapeutic exercises, 97530- Therapeutic activity, 97112- Neuromuscular re-education, 97535- Self Care, 02859- Manual therapy, G0283- Electrical stimulation (unattended), 20560 (1-2 muscles), 20561 (3+ muscles)- Dry Needling, Patient/Family education, Balance training, Taping, Joint mobilization, Joint manipulation, Cryotherapy, and Moist heat  PLAN FOR NEXT SESSION: ankle and hip stability, hip and ankle strengthening, SL activities, stretching for calf. HEP review.   Marijo Berber PT, DPT 04/30/2024 10:38 AM  "

## 2024-05-05 ENCOUNTER — Ambulatory Visit

## 2024-05-11 ENCOUNTER — Ambulatory Visit

## 2024-05-12 ENCOUNTER — Telehealth: Payer: Self-pay

## 2024-05-12 NOTE — Telephone Encounter (Signed)
1st NS - left VM.

## 2024-05-14 ENCOUNTER — Ambulatory Visit

## 2024-05-14 DIAGNOSIS — M7660 Achilles tendinitis, unspecified leg: Secondary | ICD-10-CM | POA: Diagnosis not present

## 2024-05-14 NOTE — Therapy (Signed)
 " OUTPATIENT PHYSICAL THERAPY NOTE   Patient Name: Emily Ochoa MRN: [id number] DOB:08/24/1980, 44 y.o., female Today's Date: 05/14/2024  END OF SESSION:  PT End of Session - 05/14/24 1116     Visit Number 6    Number of Visits 16    Date for Recertification  05/27/24    Authorization Type [employer] DUAL COMPLETE/ MCD    Authorization Time Period no auth required    Progress Note Due on Visit 10    PT Start Time 1115    PT Stop Time 1145    PT Time Calculation (min) 30 min    Activity Tolerance Patient tolerated treatment well    Behavior During Therapy [facility] for tasks assessed/performed             Past Medical History:  Diagnosis Date   Abdominal pain 11/20/2011   Atypical facial pain 11/20/2011   Backache    Bulging lumbar disc 11/03/2014   Cardiac dysrhythmia 08/12/2011   Carpal tunnel syndrome 11/20/2011   Cholecystitis 06/16/2016   Diplopia 11/20/2011   Edema 11/20/2011   Elevated blood sugar 11/03/2014   Essential hypertension 08/12/2011   Fibromyalgia    Gastroesophageal reflux disease 11/04/2014   Gout 11/20/2011   Herpes simplex 11/20/2011   Iron deficiency anemia secondary to inadequate dietary iron intake 11/20/2011   Lytic lesion of bone on x-ray 05/17/2020   Major depressive disorder 11/14/2021   Malaise and fatigue 11/20/2011   Migraine headache    Mild cognitive impairment 11/27/2021   Morbid obesity with BMI of 60.0-69.9, adult 11/14/2021   Myalgia 11/20/2011   Nausea with vomiting 11/20/2011   Palpitations 08/30/2011   Pruritus of genitalia 11/20/2011   Shortness of breath 08/30/2011   URI (upper respiratory infection)    Past Surgical History:  Procedure Laterality Date   BACK SURGERY     bioposy in 2009   CHOLECYSTECTOMY     Patient Active Problem List   Diagnosis Date Noted   Fibromyalgia 11/27/2021   Mild cognitive impairment 11/27/2021   Backache    Migraine headache    Morbid obesity with BMI of 60.0-69.9, adult 11/14/2021    Major depressive disorder 11/14/2021   Lytic lesion of bone on x-ray 05/17/2020   Cholecystitis 06/16/2016   Gastroesophageal reflux disease 11/04/2014   Elevated blood sugar 11/03/2014   Bulging lumbar disc 11/03/2014   Atypical facial pain 11/20/2011   Carpal tunnel syndrome 11/20/2011   Diplopia 11/20/2011   Edema 11/20/2011   Gout 11/20/2011   Herpes simplex 11/20/2011   Iron deficiency anemia secondary to inadequate dietary iron intake 11/20/2011   Malaise and fatigue 11/20/2011   Myalgia 11/20/2011   Pruritus of genitalia 11/20/2011   Palpitations 08/30/2011   Shortness of breath 08/30/2011   Cardiac dysrhythmia 08/12/2011   Essential hypertension 08/12/2011    PCP: Missy Reusing, PA-C  REFERRING PROVIDER: Lamount Ethan CROME, DPM  REFERRING DIAG: [phone] (ICD-10-CM) - 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   EVAL: Pt reports her pain began 2 months ago after going to the gym. She reports having lost weight and beginning going to the gym/running since earlier this year and suddenly could not put her foot down. Pt notes no trauma to the ankle/foot. She was put into a boot which she wore for about  a month. Her pain has improved greatly but she is not back to her gym routine. The podiatrist wants the pt to continue use of the boot but the pt reports L hip pain when wearing it. Pt is having pain with prolonged walking, squatting, running, and use of the elliptical.  She has a history of plantar fasciitis.    PERTINENT HISTORY: Fibromyalgia, HTN PAIN:  Are you having pain? Yes: NPRS scale: 0/10; 4/10 worst; 0/10 best Pain location: L achilles Pain description: tension Aggravating factors: squats, lots of walking, running, elliptical  Relieving  factors: ice, stretches  PRECAUTIONS: None  RED FLAGS: None   WEIGHT BEARING RESTRICTIONS: No  FALLS:  Has patient fallen in last 6 months? No  LIVING ENVIRONMENT: Lives with: lives alone Lives in: House/apartment Stairs: No Has following equipment at home: None  OCCUPATION: daycare  PLOF: Independent  PATIENT GOALS: return to the gym   NEXT MD VISIT: 04/15/2024  OBJECTIVE:  Note: Objective measures were completed at Evaluation unless otherwise noted.  PATIENT SURVEYS:  LEFS  Extreme difficulty/unable (0), Quite a bit of difficulty (1), Moderate difficulty (2), Little difficulty (3), No difficulty (4) Survey date:  04/01/2024   Any of your usual work, housework or school activities 2  2. Usual hobbies, recreational or sporting activities 2  3. Getting into/out of the bath 4  4. Walking between rooms 2  5. Putting on socks/shoes 2  6. Squatting  1  7. Lifting an object, like a bag of groceries from the floor 1  8. Performing light activities around your home 2  9. Performing heavy activities around your home 0  10. Getting into/out of a car 4  11. Walking 2 blocks 1  12. Walking 1 mile 0  13. Going up/down 10 stairs (1 flight) 2  14. Standing for 1 hour 2  15.  sitting for 1 hour 4  16. Running on even ground 1  17. Running on uneven ground 1  18. Making sharp turns while running fast 1  19. Hopping  1  20. Rolling over in bed 4  Score total:  37/80     COGNITION: Overall cognitive status: Within functional limits for tasks assessed     SENSATION: Not tested  POSTURE: No Significant postural limitations  PALPATION: Tenderness of L calf  LOWER EXTREMITY ROM:  Active ROM Right eval Left eval  Hip flexion    Hip extension 5 5  Hip abduction WNL WNL  Hip adduction    Hip internal rotation    Hip external rotation    Knee flexion    Knee extension    Ankle dorsiflexion 3 3  Ankle plantarflexion WNL WNL  Ankle inversion WNL WNL  Ankle  eversion WNL WNL   (Blank rows = not tested)  LOWER EXTREMITY MMT:  MMT Right eval Left eval  Hip flexion    Hip extension 4/5 4/5  Hip abduction 4/5 4/5  Hip adduction    Hip internal rotation    Hip external rotation    Knee flexion    Knee extension    Ankle dorsiflexion 5/5 5/5  Ankle plantarflexion 5/5 5/5  Ankle inversion 5/5 5/5  Ankle eversion 5/5 5/5   (Blank rows = not tested)  FUNCTIONAL TESTS:  Functional squat - tension in L achilles, unsteady  SLS - mod sway B  SL calf raise - able B but unsteady  TREATMENT DATE:   [facility] Adult PT Treatment:                                                DATE: 05/14/2024  Neuro Re-Ed:  Slant board stretch 3x30  SLS clock x 4 B  Heel walking x 2 laps  Toe walking x 2 laps  Obstacle course with hurdles and foam x 5 mins  Incline treadmill 5% @ 2.2 mph x 5 mins    [facility] Adult PT Treatment:                                                DATE: 04/01/2024    Initial evaluation: see patient education and home exercise program as noted below     PATIENT EDUCATION:  Education details: POC, HEP, diagnosis, prognosis. Person educated: Patient Education method: Explanation and Handouts Education comprehension: verbalized understanding and needs further education  HOME EXERCISE PROGRAM: Access Code: [email or web address] URL: [email or web address] Date: 04/01/2024 Prepared by: Marijo Berber  Exercises - Long Sitting Ankle Plantar Flexion with Resistance  - 1 x daily - 7 x weekly - 3 sets - 10 reps - Seated Heel Raise  - 1 x daily - 7 x weekly - 3 sets - 10 reps - Backward Monster Walks  - 1 x daily - 7 x weekly - 3 sets - 10 reps - Downward Dog  - 1 x daily - 7 x weekly - 1 sets - 10 reps - 10sec hold - Clamshell with Resistance  - 1 x daily - 7 x weekly - 3 sets - 10 reps  ASSESSMENT:  CLINICAL  IMPRESSION: 05/14/2024: pt arrived 30 mins late so an abbreviated session was performed. Focus was on balance activities.  Pt able to perform all exercises with some cueing to adjust form. Tension in foot gone by end of session.  The pt will benefit from skilled physical therapy to decrease pain and increase function.    EVAL: Patient is a 44 y.o. female who was seen today for physical therapy evaluation and treatment for L ankle pain. Pt has good ankle ROM and strength but poor stability in SLS. Pain is present with prolonged walking and squatting. Both activities challenge the pts balance. She is having limitations at work, with recreational activities, and ADLs. The pt will benefit from skilled physical therapy to decrease pain and increase function.    OBJECTIVE IMPAIRMENTS: decreased balance, decreased ROM, decreased strength, hypomobility, impaired flexibility, improper body mechanics, and pain.   ACTIVITY LIMITATIONS: lifting and squatting  PARTICIPATION LIMITATIONS: community activity and occupation  PERSONAL FACTORS: 1-2 comorbidities: Fibromyalgia, HTN are also affecting patient's functional outcome.   REHAB POTENTIAL: Good  CLINICAL DECISION MAKING: Evolving/moderate complexity  EVALUATION COMPLEXITY: Moderate   GOALS: Goals reviewed with patient? Yes  SHORT TERM GOALS: Target date: 04/29/2024 Pt will be compliant and independent with HEP to assist with symptom management/recovery at home.  Baseline: 53JPMY2C Goal status: INITIAL  2.  Pt will demonstrate 1 stable SL calf raise B.  Baseline: unstable  Goal status: INITIAL   LONG TERM GOALS: Target date: 05/27/2024  Pt will be able to perform 10 squats with 20lbs or more without LOB.  Baseline: unstable without weight Goal  status: INITIAL  2.  Pt will demonstrate 4+/5 or greater strength in B hips.  Baseline:  MMT Right eval Left eval  Hip flexion    Hip extension 4/5 4/5  Hip abduction 4/5 4/5   Goal status:  INITIAL  3.  Pt will score 50/80 or greater on the LEFS.  Baseline: 37/80 Goal status: INITIAL  4.  Pt will be comfortable with her final HEP in order to continue any symptom management at home and to avoid regression.   Baseline: 53JPMY2C Goal status: INITIAL   PLAN:  PT FREQUENCY: 2x/week  PT DURATION: 8 weeks  PLANNED INTERVENTIONS: 97164- PT Re-evaluation, 97110-Therapeutic exercises, 97530- Therapeutic activity, 97112- Neuromuscular re-education, 97535- Self Care, 02859- Manual therapy, G0283- Electrical stimulation (unattended), 20560 (1-2 muscles), 20561 (3+ muscles)- Dry Needling, Patient/Family education, Balance training, Taping, Joint mobilization, Joint manipulation, Cryotherapy, and Moist heat  PLAN FOR NEXT SESSION: ankle and hip stability, hip and ankle strengthening, SL activities, stretching for calf. HEP review.   Marijo Berber PT, DPT 05/14/2024 11:23 AM  "

## 2024-05-19 ENCOUNTER — Ambulatory Visit

## 2024-05-19 DIAGNOSIS — M7660 Achilles tendinitis, unspecified leg: Secondary | ICD-10-CM | POA: Diagnosis not present

## 2024-05-19 NOTE — Therapy (Signed)
 " OUTPATIENT PHYSICAL THERAPY DISCHARGE  PHYSICAL THERAPY DISCHARGE SUMMARY  Visits from Start of Care:    Current functional level related to goals / functional outcomes: See objective findings/assessment    Remaining deficits: See objective findings/assessment    Education / Equipment: See today's treatment/assessment      Patient agrees to discharge. Patient goals were met. Patient is being discharged due to meeting the stated rehab goals.    Patient Name: Emily Ochoa MRN: [id number] DOB:[birth year], 44 y.o., female Today's Date: 05/19/2024  END OF SESSION:  PT End of Session - 05/19/24 1412     Visit Number 7    Number of Visits 16    Date for Recertification  05/27/24    Authorization Type [employer] DUAL COMPLETE/ MCD    Authorization Time Period no auth required    Progress Note Due on Visit 10    PT Start Time 1412    PT Stop Time 1430    PT Time Calculation (min) 18 min          Past Medical History:  Diagnosis Date   Abdominal pain 11/20/2011   Atypical facial pain 11/20/2011   Backache    Bulging lumbar disc 11/03/2014   Cardiac dysrhythmia 08/12/2011   Carpal tunnel syndrome 11/20/2011   Cholecystitis 06/16/2016   Diplopia 11/20/2011   Edema 11/20/2011   Elevated blood sugar 11/03/2014   Essential hypertension 08/12/2011   Fibromyalgia    Gastroesophageal reflux disease 11/04/2014   Gout 11/20/2011   Herpes simplex 11/20/2011   Iron deficiency anemia secondary to inadequate dietary iron intake 11/20/2011   Lytic lesion of bone on x-ray 05/17/2020   Major depressive disorder 11/14/2021   Malaise and fatigue 11/20/2011   Migraine headache    Mild cognitive impairment 11/27/2021   Morbid obesity with BMI of 60.0-69.9, adult 11/14/2021   Myalgia 11/20/2011   Nausea with vomiting 11/20/2011   Palpitations 08/30/2011   Pruritus of genitalia 11/20/2011   Shortness of breath 08/30/2011   URI (upper respiratory infection)    Past Surgical History:   Procedure Laterality Date   BACK SURGERY     bioposy in 2009   CHOLECYSTECTOMY     Patient Active Problem List   Diagnosis Date Noted   Fibromyalgia 11/27/2021   Mild cognitive impairment 11/27/2021   Backache    Migraine headache    Morbid obesity with BMI of 60.0-69.9, adult 11/14/2021   Major depressive disorder 11/14/2021   Lytic lesion of bone on x-ray 05/17/2020   Cholecystitis 06/16/2016   Gastroesophageal reflux disease 11/04/2014   Elevated blood sugar 11/03/2014   Bulging lumbar disc 11/03/2014   Atypical facial pain 11/20/2011   Carpal tunnel syndrome 11/20/2011   Diplopia 11/20/2011   Edema 11/20/2011   Gout 11/20/2011   Herpes simplex 11/20/2011   Iron deficiency anemia secondary to inadequate dietary iron intake 11/20/2011   Malaise and fatigue 11/20/2011   Myalgia 11/20/2011   Pruritus of genitalia 11/20/2011   Palpitations 08/30/2011   Shortness of breath 08/30/2011   Cardiac dysrhythmia 08/12/2011   Essential hypertension 08/12/2011    PCP: Missy Reusing, PA-C  REFERRING PROVIDER: Lamount Ethan CROME, DPM  REFERRING DIAG: [phone] (ICD-10-CM) - Calcific Achilles tendinitis of left lower extremity  THERAPY DIAG:  Achilles tendon pain  Rationale for Evaluation and Treatment: Rehabilitation  ONSET DATE: 2 months ago  SUBJECTIVE:   SUBJECTIVE STATEMENT: Discharge:  Pt reports she has returned to PLOF without ankle pain or discomfort. She continues to perform HEP  daily. She is agreeable to d/c today.   EVAL: Pt reports her pain began 2 months ago after going to the gym. She reports having lost weight and beginning going to the gym/running since earlier this year and suddenly could not put her foot down. Pt notes no trauma to the ankle/foot. She was put into a boot which she wore for about a month. Her pain has improved greatly but she is not back to her gym routine. The podiatrist wants the pt to continue use of the boot but the pt reports L hip pain when  wearing it. Pt is having pain with prolonged walking, squatting, running, and use of the elliptical.  She has a history of plantar fasciitis.    PERTINENT HISTORY: Fibromyalgia, HTN PAIN:  Are you having pain? Yes: NPRS scale: 0/10; 4/10 worst; 0/10 best Pain location: L achilles Pain description: tension Aggravating factors: squats, lots of walking, running, elliptical  Relieving factors: ice, stretches  PRECAUTIONS: None  RED FLAGS: None   WEIGHT BEARING RESTRICTIONS: No  FALLS:  Has patient fallen in last 6 months? No  LIVING ENVIRONMENT: Lives with: lives alone Lives in: House/apartment Stairs: No Has following equipment at home: None  OCCUPATION: daycare  PLOF: Independent  PATIENT GOALS: return to the gym   NEXT MD VISIT: 04/15/2024  OBJECTIVE:  Note: Objective measures were completed at Evaluation unless otherwise noted.  PATIENT SURVEYS:  LEFS  Extreme difficulty/unable (0), Quite a bit of difficulty (1), Moderate difficulty (2), Little difficulty (3), No difficulty (4) Survey date:  04/01/2024  05/19/2024   Any of your usual work, housework or school activities 2 4  2. Usual hobbies, recreational or sporting activities 2 3  3. Getting into/out of the bath 4 4  4. Walking between rooms 2 4  5. Putting on socks/shoes 2 4  6. Squatting  1 4  7. Lifting an object, like a bag of groceries from the floor 1 4  8. Performing light activities around your home 2 4  9. Performing heavy activities around your home 0 4  10. Getting into/out of a car 4 4  11. Walking 2 blocks 1 4  12. Walking 1 mile 0 4  13. Going up/down 10 stairs (1 flight) 2 3  14. Standing for 1 hour 2 4  15.  sitting for 1 hour 4 4  16. Running on even ground 1 4  17. Running on uneven ground 1 3  18. Making sharp turns while running fast 1 4  19. Hopping  1 4  20. Rolling over in bed 4 4  Score total:  37/80 77/80     COGNITION: Overall cognitive status: Within functional limits  for tasks assessed     SENSATION: Not tested  POSTURE: No Significant postural limitations  PALPATION: Tenderness of L calf  LOWER EXTREMITY ROM:  Active ROM Right eval Left eval  Hip flexion    Hip extension 5 5  Hip abduction WNL WNL  Hip adduction    Hip internal rotation    Hip external rotation    Knee flexion    Knee extension    Ankle dorsiflexion 3 3  Ankle plantarflexion WNL WNL  Ankle inversion WNL WNL  Ankle eversion WNL WNL   (Blank rows = not tested)  LOWER EXTREMITY MMT:  MMT Right eval Left eval Right 05/19/2024 Left  05/19/2024  Hip flexion      Hip extension 4/5 4/5 5/5 5/5  Hip abduction 4/5 4/5  5/5 5/5  Hip adduction      Hip internal rotation      Hip external rotation      Knee flexion      Knee extension      Ankle dorsiflexion 5/5 5/5    Ankle plantarflexion 5/5 5/5    Ankle inversion 5/5 5/5    Ankle eversion 5/5 5/5     (Blank rows = not tested)  FUNCTIONAL TESTS:  Functional squat - tension in L achilles, unsteady; 05/19/2024 no pain or tension, steady SLS - mod sway B; 05/19/2024 able without sway  SL calf raise - able B but unsteady; 05/19/2024 able and steady                                                                                                                               TREATMENT DATE:  [facility] Adult PT Treatment:                                                DATE: 05/19/2024   Therapeutic Activity:  Tests and measures    [facility] Adult PT Treatment:                                                DATE: 05/14/2024  Neuro Re-Ed:  Cole board stretch 3x30  SLS clock x 4 B  Heel walking x 2 laps  Toe walking x 2 laps  Obstacle course with hurdles and foam x 5 mins  Incline treadmill 5% @ 2.2 mph x 5 mins    [facility] Adult PT Treatment:                                                DATE: 04/01/2024    Initial evaluation: see patient education and home exercise program as noted below     PATIENT EDUCATION:  Education  details: POC, HEP, diagnosis, prognosis. Person educated: Patient Education method: Explanation and Handouts Education comprehension: verbalized understanding and needs further education  HOME EXERCISE PROGRAM: Access Code: [email or web address] URL: [email or web address] Date: 04/01/2024 Prepared by: Marijo Berber  Exercises - Long Sitting Ankle Plantar Flexion with Resistance  - 1 x daily - 7 x weekly - 3 sets - 10 reps - Seated Heel Raise  - 1 x daily - 7 x weekly - 3 sets - 10 reps - Backward Monster Walks  - 1 x daily - 7 x weekly - 3 sets - 10 reps - Downward Dog  - 1 x daily - 7 x weekly - 1 sets -  10 reps - 10sec hold - Clamshell with Resistance  - 1 x daily - 7 x weekly - 3 sets - 10 reps  ASSESSMENT:  CLINICAL IMPRESSION: Discharge:  Pt has been seen for 7 PT visits to address her ankle pain. The pt has demonstrated improved strength, balance, and functional ability. She has fully returned to the gym and running without ankle pain. She was advised to slowly decrease the number of days the HEP was performed. Pt is d/c as of today.  EVAL: Patient is a 45 y.o. female who was seen today for physical therapy evaluation and treatment for L ankle pain. Pt has good ankle ROM and strength but poor stability in SLS. Pain is present with prolonged walking and squatting. Both activities challenge the pts balance. She is having limitations at work, with recreational activities, and ADLs. The pt will benefit from skilled physical therapy to decrease pain and increase function.    OBJECTIVE IMPAIRMENTS: decreased balance, decreased ROM, decreased strength, hypomobility, impaired flexibility, improper body mechanics, and pain.   ACTIVITY LIMITATIONS: lifting and squatting  PARTICIPATION LIMITATIONS: community activity and occupation  PERSONAL FACTORS: 1-2 comorbidities: Fibromyalgia, HTN are also affecting patient's functional outcome.   REHAB POTENTIAL: Good  CLINICAL DECISION  MAKING: Evolving/moderate complexity  EVALUATION COMPLEXITY: Moderate   GOALS: Goals reviewed with patient? Yes  SHORT TERM GOALS: Target date: 04/29/2024 Pt will be compliant and independent with HEP to assist with symptom management/recovery at home.  Baseline: 53JPMY2C Goal status: MET  2.  Pt will demonstrate 1 stable SL calf raise B.  Baseline: unstable  05/19/2024: stable  Goal status: MET   LONG TERM GOALS: Target date: 05/27/2024  Pt will be able to perform 10 squats with 20lbs or more without LOB.  Baseline: unstable without weight 05/19/2024: stable with 30lbs  Goal status: MET  2.  Pt will demonstrate 4+/5 or greater strength in B hips.  Baseline:  MMT Right eval Left eval  Hip flexion    Hip extension 4/5 4/5  Hip abduction 4/5 4/5  05/19/2024: see objective  Goal status: MET  3.  Pt will score 50/80 or greater on the LEFS.  Baseline: 37/80 05/19/2024: 77/80 Goal status: MET  4.  Pt will be comfortable with her final HEP in order to continue any symptom management at home and to avoid regression.   Baseline: 53JPMY2C Goal status: MET   PLAN:  PT FREQUENCY: 2x/week  PT DURATION: 8 weeks  PLANNED INTERVENTIONS: 97164- PT Re-evaluation, 97110-Therapeutic exercises, 97530- Therapeutic activity, 97112- Neuromuscular re-education, 97535- Self Care, [id number]- Manual therapy, [id number]- Electrical stimulation (unattended), 20560 (1-2 muscles), 20561 (3+ muscles)- Dry Needling, Patient/Family education, Balance training, Taping, Joint mobilization, Joint manipulation, Cryotherapy, and Moist heat  PLAN FOR NEXT SESSION: ankle and hip stability, hip and ankle strengthening, SL activities, stretching for calf. HEP review.   Marijo Berber PT, DPT 05/19/2024 2:50 PM  "

## 2024-05-24 ENCOUNTER — Ambulatory Visit: Admitting: Podiatry
